# Patient Record
Sex: Female | Born: 1935
Health system: Southern US, Community
[De-identification: ages and names within clinical notes are randomized; demographics above are authoritative.]

## PROBLEM LIST (undated history)

## (undated) DIAGNOSIS — H3530 Unspecified macular degeneration: Secondary | ICD-10-CM

## (undated) DIAGNOSIS — M810 Age-related osteoporosis without current pathological fracture: Secondary | ICD-10-CM

## (undated) DIAGNOSIS — F0390 Unspecified dementia without behavioral disturbance: Secondary | ICD-10-CM

## (undated) DIAGNOSIS — M1990 Unspecified osteoarthritis, unspecified site: Secondary | ICD-10-CM

## (undated) DIAGNOSIS — C50919 Malignant neoplasm of unspecified site of unspecified female breast: Secondary | ICD-10-CM

## (undated) DIAGNOSIS — S329XXA Fracture of unspecified parts of lumbosacral spine and pelvis, initial encounter for closed fracture: Secondary | ICD-10-CM

## (undated) DIAGNOSIS — I10 Essential (primary) hypertension: Secondary | ICD-10-CM

## (undated) HISTORY — PX: OTHER SURGICAL HISTORY: SHX169

## (undated) HISTORY — DX: Age-related osteoporosis without current pathological fracture: M81.0

## (undated) HISTORY — DX: Malignant neoplasm of unspecified site of unspecified female breast: C50.919

## (undated) HISTORY — DX: Unspecified osteoarthritis, unspecified site: M19.90

## (undated) HISTORY — DX: Unspecified macular degeneration: H35.30

## (undated) HISTORY — PX: TOTAL HIP ARTHROPLASTY: SHX124

## (undated) HISTORY — DX: Unspecified dementia, unspecified severity, without behavioral disturbance, psychotic disturbance, mood disturbance, and anxiety: F03.90

## (undated) HISTORY — PX: BREAST LUMPECTOMY: SHX2

## (undated) HISTORY — DX: Essential (primary) hypertension: I10

---
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
## 2004-02-16 ENCOUNTER — Encounter: Payer: Self-pay | Admitting: Pain Medicine

## 2004-03-18 ENCOUNTER — Encounter: Payer: Self-pay | Admitting: Pain Medicine

## 2004-04-17 ENCOUNTER — Encounter: Payer: Self-pay | Admitting: Pain Medicine

## 2004-05-27 ENCOUNTER — Emergency Department: Payer: Self-pay | Admitting: Emergency Medicine

## 2004-05-28 ENCOUNTER — Ambulatory Visit: Payer: Self-pay | Admitting: Internal Medicine

## 2004-06-17 ENCOUNTER — Encounter: Payer: Self-pay | Admitting: Internal Medicine

## 2004-06-18 ENCOUNTER — Encounter: Payer: Self-pay | Admitting: Internal Medicine

## 2004-07-16 ENCOUNTER — Encounter: Payer: Self-pay | Admitting: Internal Medicine

## 2004-08-16 ENCOUNTER — Encounter: Payer: Self-pay | Admitting: Internal Medicine

## 2004-09-15 ENCOUNTER — Encounter: Payer: Self-pay | Admitting: Internal Medicine

## 2004-10-03 ENCOUNTER — Emergency Department: Payer: Self-pay | Admitting: Emergency Medicine

## 2004-10-10 ENCOUNTER — Ambulatory Visit: Payer: Self-pay | Admitting: Internal Medicine

## 2004-10-20 ENCOUNTER — Encounter: Payer: Self-pay | Admitting: Rheumatology

## 2005-02-26 ENCOUNTER — Ambulatory Visit: Payer: Self-pay | Admitting: Internal Medicine

## 2005-03-30 ENCOUNTER — Emergency Department: Payer: Self-pay | Admitting: Emergency Medicine

## 2005-05-03 ENCOUNTER — Emergency Department: Payer: Self-pay | Admitting: Emergency Medicine

## 2005-07-23 ENCOUNTER — Encounter: Payer: Self-pay | Admitting: Internal Medicine

## 2005-08-16 ENCOUNTER — Encounter: Payer: Self-pay | Admitting: Internal Medicine

## 2005-09-15 ENCOUNTER — Encounter: Payer: Self-pay | Admitting: Internal Medicine

## 2005-09-25 ENCOUNTER — Emergency Department: Payer: Self-pay | Admitting: Emergency Medicine

## 2005-09-26 ENCOUNTER — Ambulatory Visit: Payer: Self-pay | Admitting: Emergency Medicine

## 2005-09-26 ENCOUNTER — Other Ambulatory Visit: Payer: Self-pay

## 2005-10-16 ENCOUNTER — Encounter: Payer: Self-pay | Admitting: Internal Medicine

## 2005-11-15 ENCOUNTER — Encounter: Payer: Self-pay | Admitting: Internal Medicine

## 2005-12-16 ENCOUNTER — Encounter: Payer: Self-pay | Admitting: Internal Medicine

## 2006-01-16 ENCOUNTER — Encounter: Payer: Self-pay | Admitting: Internal Medicine

## 2006-02-15 ENCOUNTER — Encounter: Payer: Self-pay | Admitting: Internal Medicine

## 2006-03-21 ENCOUNTER — Inpatient Hospital Stay: Payer: Self-pay | Admitting: Internal Medicine

## 2006-03-21 ENCOUNTER — Other Ambulatory Visit: Payer: Self-pay

## 2006-03-22 ENCOUNTER — Other Ambulatory Visit: Payer: Self-pay

## 2006-03-24 ENCOUNTER — Encounter: Payer: Self-pay | Admitting: Internal Medicine

## 2006-06-01 ENCOUNTER — Ambulatory Visit: Payer: Self-pay | Admitting: Internal Medicine

## 2007-03-24 ENCOUNTER — Ambulatory Visit: Payer: Self-pay | Admitting: Unknown Physician Specialty

## 2007-07-20 ENCOUNTER — Ambulatory Visit: Payer: Self-pay | Admitting: Obstetrics and Gynecology

## 2007-10-03 ENCOUNTER — Ambulatory Visit: Payer: Self-pay | Admitting: Unknown Physician Specialty

## 2008-08-16 ENCOUNTER — Ambulatory Visit: Payer: Self-pay | Admitting: Obstetrics and Gynecology

## 2008-09-20 ENCOUNTER — Ambulatory Visit: Payer: Self-pay | Admitting: Internal Medicine

## 2008-12-20 ENCOUNTER — Ambulatory Visit: Payer: Self-pay | Admitting: Gastroenterology

## 2009-02-11 ENCOUNTER — Ambulatory Visit: Payer: Self-pay | Admitting: Unknown Physician Specialty

## 2009-02-28 ENCOUNTER — Ambulatory Visit: Payer: Self-pay | Admitting: Unknown Physician Specialty

## 2009-03-04 ENCOUNTER — Ambulatory Visit: Payer: Self-pay | Admitting: Unknown Physician Specialty

## 2009-08-19 ENCOUNTER — Ambulatory Visit: Payer: Self-pay | Admitting: Obstetrics and Gynecology

## 2014-06-18 HISTORY — PX: NM MYOVIEW LTD: HXRAD82

## 2017-03-18 HISTORY — PX: LEFT HEART CATH AND CORONARY ANGIOGRAPHY: CATH118249

## 2018-04-17 DIAGNOSIS — S72001S Fracture of unspecified part of neck of right femur, sequela: Secondary | ICD-10-CM

## 2018-04-17 HISTORY — DX: Fracture of unspecified part of neck of right femur, sequela: S72.001S

## 2018-05-10 HISTORY — PX: TRANSTHORACIC ECHOCARDIOGRAM: SHX275

## 2018-11-14 ENCOUNTER — Emergency Department (HOSPITAL_COMMUNITY): Payer: Medicare Other

## 2018-11-14 ENCOUNTER — Emergency Department (HOSPITAL_COMMUNITY)
Admission: EM | Admit: 2018-11-14 | Discharge: 2018-11-14 | Disposition: A | Discharge status: Home / Self Care | Payer: Medicare Other | Attending: Emergency Medicine | Admitting: Emergency Medicine

## 2018-11-14 ENCOUNTER — Encounter (HOSPITAL_COMMUNITY): Payer: Self-pay

## 2018-11-14 ENCOUNTER — Other Ambulatory Visit: Payer: Self-pay

## 2018-11-14 DIAGNOSIS — S329XXA Fracture of unspecified parts of lumbosacral spine and pelvis, initial encounter for closed fracture: Secondary | ICD-10-CM

## 2018-11-14 DIAGNOSIS — Y939 Activity, unspecified: Secondary | ICD-10-CM | POA: Insufficient documentation

## 2018-11-14 DIAGNOSIS — Y92128 Other place in nursing home as the place of occurrence of the external cause: Secondary | ICD-10-CM | POA: Insufficient documentation

## 2018-11-14 DIAGNOSIS — M25551 Pain in right hip: Secondary | ICD-10-CM | POA: Diagnosis present

## 2018-11-14 DIAGNOSIS — W19XXXA Unspecified fall, initial encounter: Secondary | ICD-10-CM | POA: Diagnosis not present

## 2018-11-14 DIAGNOSIS — F0390 Unspecified dementia without behavioral disturbance: Secondary | ICD-10-CM | POA: Insufficient documentation

## 2018-11-14 DIAGNOSIS — Z7901 Long term (current) use of anticoagulants: Secondary | ICD-10-CM | POA: Diagnosis not present

## 2018-11-14 DIAGNOSIS — Z20828 Contact with and (suspected) exposure to other viral communicable diseases: Secondary | ICD-10-CM | POA: Insufficient documentation

## 2018-11-14 DIAGNOSIS — Y999 Unspecified external cause status: Secondary | ICD-10-CM | POA: Diagnosis not present

## 2018-11-14 DIAGNOSIS — S3282XA Multiple fractures of pelvis without disruption of pelvic ring, initial encounter for closed fracture: Secondary | ICD-10-CM | POA: Insufficient documentation

## 2018-11-14 HISTORY — DX: Fracture of unspecified parts of lumbosacral spine and pelvis, initial encounter for closed fracture: S32.9XXA

## 2018-11-14 LAB — BASIC METABOLIC PANEL
Anion gap: 9 (ref 5–15)
BUN: 19 mg/dL (ref 8–23)
CO2: 26 mmol/L (ref 22–32)
Calcium: 9 mg/dL (ref 8.9–10.3)
Chloride: 105 mmol/L (ref 98–111)
Creatinine, Ser: 0.73 mg/dL (ref 0.44–1.00)
GFR calc Af Amer: >60 mL/min (ref >60)
GFR calc non Af Amer: >60 mL/min (ref >60)
Glucose, Bld: 99 mg/dL (ref 70–99)
Potassium: 3.7 mmol/L (ref 3.5–5.1)
Sodium: 140 mmol/L (ref 135–145)

## 2018-11-14 LAB — CBC WITH DIFFERENTIAL/PLATELET
Abs Immature Granulocytes: 0.16 10*3/uL — ABNORMAL HIGH (ref 0.00–0.07)
Basophils Absolute: 0 10*3/uL (ref 0.0–0.1)
Basophils Relative: 0 %
Eosinophils Absolute: 0.2 10*3/uL (ref 0.0–0.5)
Eosinophils Relative: 1 %
HCT: 41.4 % (ref 36.0–46.0)
Hemoglobin: 13 g/dL (ref 12.0–15.0)
Immature Granulocytes: 1 %
Lymphocytes Relative: 8 %
Lymphs Abs: 1.1 10*3/uL (ref 0.7–4.0)
MCH: 30.5 pg (ref 26.0–34.0)
MCHC: 31.4 g/dL (ref 30.0–36.0)
MCV: 97.2 fL (ref 80.0–100.0)
Monocytes Absolute: 0.6 10*3/uL (ref 0.1–1.0)
Monocytes Relative: 5 %
Neutro Abs: 10.7 10*3/uL — ABNORMAL HIGH (ref 1.7–7.7)
Neutrophils Relative %: 85 %
Platelets: 174 10*3/uL (ref 150–400)
RBC: 4.26 MIL/uL (ref 3.87–5.11)
RDW: 13.3 % (ref 11.5–15.5)
WBC: 12.8 10*3/uL — ABNORMAL HIGH (ref 4.0–10.5)
nRBC: 0 % (ref 0.0–0.2)

## 2018-11-14 LAB — SARS CORONAVIRUS 2 BY RT PCR (HOSPITAL ORDER, PERFORMED IN ~~LOC~~ HOSPITAL LAB): SARS Coronavirus 2: NEGATIVE

## 2018-11-14 MED ORDER — FENTANYL CITRATE (PF) 100 MCG/2ML IJ SOLN
50.0000 ug | Freq: Once | INTRAMUSCULAR | Status: AC
  Administered 2018-11-14: 50 ug via INTRAVENOUS
  Filled 2018-11-14: qty 2

## 2018-11-14 MED ORDER — TRAMADOL HCL 50 MG PO TABS: 50.0000 mg | ORAL_TABLET | Freq: Four times a day (QID) | ORAL | 0 refills | Status: DC | PRN

## 2018-11-14 MED ORDER — CARVEDILOL 3.125 MG PO TABS
3.1250 mg | ORAL_TABLET | Freq: Two times a day (BID) | ORAL | Status: DC
  Administered 2018-11-14: 15:00:00 3.125 mg via ORAL
  Filled 2018-11-14 (×2): qty 1

## 2018-11-14 MED ORDER — FENTANYL CITRATE (PF) 100 MCG/2ML IJ SOLN
50.0000 ug | Freq: Once | INTRAMUSCULAR | Status: AC
  Administered 2018-11-14: 15:00:00 50 ug via INTRAVENOUS
  Filled 2018-11-14: qty 2

## 2018-11-14 MED ORDER — AMLODIPINE BESYLATE 5 MG PO TABS
2.5000 mg | ORAL_TABLET | Freq: Two times a day (BID) | ORAL | Status: DC
  Administered 2018-11-14: 2.5 mg via ORAL
  Filled 2018-11-14: qty 1

## 2018-11-14 MED ORDER — MEMANTINE HCL 10 MG PO TABS
10.0000 mg | ORAL_TABLET | Freq: Two times a day (BID) | ORAL | Status: DC
  Administered 2018-11-14: 15:00:00 10 mg via ORAL
  Filled 2018-11-14 (×2): qty 1

## 2018-11-14 MED ORDER — ACETAMINOPHEN 500 MG PO TABS
1000.0000 mg | ORAL_TABLET | Freq: Once | ORAL | Status: AC
  Administered 2018-11-14: 13:00:00 1000 mg via ORAL
  Filled 2018-11-14: qty 2

## 2018-11-14 NOTE — ED Notes (Signed)
Bed: VQ25 Expected date:  Expected time:  Means of arrival:  Comments: 89 fall

## 2018-11-14 NOTE — ED Provider Notes (Signed)
Care assumed at shift change from [place], PA-C  Unwitnessed fall at living facility. Patient lives with her husband at the facility.  Level 5 CAVEAT Dementia  Right hip pain with malrotation. Dec 2019 right hip arthroplasty by [facility] Dr. Karenann Cai.  Normal neuro exam without deficits. No midline cervical tenderness or Neurologic abnormalities.  Patient pending CMP, COVID and plain film right hip, hand, chest at care transfer.  Plan to consult Ortho if abnormal xray.  Physical Exam  BP (!) 152/68   Pulse (!) 58   Temp 97.7 F (36.5 C) (Oral)   Resp 19   Ht 5\' 6"  (1.676 m)   Wt 77.1 kg   SpO2 93%   BMI 27.44 kg/m   Physical Exam Vitals signs and nursing note reviewed. Exam conducted with a chaperone present.  Constitutional:      General: She is not in acute distress.    Appearance: She is well-developed. She is not ill-appearing, toxic-appearing or diaphoretic.  HENT:     Head: Normocephalic and atraumatic.     Jaw: There is normal jaw occlusion.     Mouth/Throat:     Lips: Pink.     Mouth: Mucous membranes are dry.     Pharynx: Oropharynx is clear.  Eyes:     Pupils: Pupils are equal, round, and reactive to light.  Neck:     Musculoskeletal: Full passive range of motion without pain, normal range of motion and neck supple.     Trachea: Phonation normal.  Cardiovascular:     Rate and Rhythm: Normal rate.     Pulses: Normal pulses.          Dorsalis pedis pulses are 2+ on the right side and 2+ on the left side.       Posterior tibial pulses are 2+ on the right side and 2+ on the left side.     Heart sounds: Normal heart sounds. No murmur. No friction rub. No gallop.   Pulmonary:     Effort: Pulmonary effort is normal. No respiratory distress.     Breath sounds: Normal breath sounds.  Chest:     Comments: No chest wall TTP.  No tenderness over bilateral ribs. Abdominal:     General: Bowel sounds are normal. There is no distension.     Palpations: Abdomen is soft.   Tenderness: There is no abdominal tenderness. There is no right CVA tenderness, left CVA tenderness or guarding.     Hernia: No hernia is present.     Comments: Soft, Nontender without rebound or guarding.  No overlying skin changes to abdominal wall.  No flank tenderness.  Normoactive bowel sounds.  No abdominal wall herniations.  Musculoskeletal: Normal range of motion.     Right shoulder: Normal.     Left shoulder: Normal.     Right elbow: Normal.    Left elbow: Normal.     Right wrist: Normal.     Left wrist: Normal.     Right hip: She exhibits tenderness. She exhibits normal range of motion, normal strength, no bony tenderness, no swelling, no crepitus, no deformity and no laceration.     Left hip: Normal.     Right knee: Normal.     Left knee: Normal.     Right ankle: Normal.     Left ankle: Normal.     Cervical back: Normal.     Thoracic back: Normal.     Lumbar back: Normal.     Right lower leg: Normal.  Left lower leg: Normal.     Comments: Shortening of right leg. No knee, tibia/fibular ankle tenderness palpation to right lower extremity.  Left lower extremity nontender palpation.  Pelvis with mild tenderness to palpation R>L. Stable to pressure.  No midline cervical, thoracic or lumbar tenderness palpation. Echymmosis to right ulnar aspect of right hand. Full ROM bilateral UE without difficulty.  Feet:     Right foot:     Skin integrity: Skin integrity normal.     Left foot:     Skin integrity: Skin integrity normal.  Lymphadenopathy:     Cervical: No cervical adenopathy.  Skin:    General: Skin is warm and dry.     Capillary Refill: Capillary refill takes less than 2 seconds.  Neurological:     General: No focal deficit present.     Mental Status: She is alert.     Cranial Nerves: Cranial nerves are intact.     Sensory: Sensation is intact.     Motor: Motor function is intact.     Coordination: Coordination is intact.     Comments: Oriented to person, place.   Oriented to time.  Patient ambulatory with home walker with personal assistance.  This is her baseline.    ED Course/Procedures     Procedures Dg Chest 1 View  Result Date: 11/14/2018 CLINICAL DATA:  Fall. EXAM: CHEST  1 VIEW COMPARISON:  No prior. FINDINGS: Mediastinum hilar structures normal. Lungs are clear. No pleural effusion or pneumothorax. Cardiomegaly with normal pulmonary vascularity. Thoracic spine scoliosis. Degenerative changes both shoulders. No evidence of displaced rib fracture. IMPRESSION: No acute abnormality identified. Electronically Signed   By: Marcello Moores  Register   On: 11/14/2018 07:34   Ct Pelvis Wo Contrast  Result Date: 11/14/2018 CLINICAL DATA:  Fall from recliner. EXAM: CT PELVIS WITHOUT CONTRAST TECHNIQUE: Multidetector CT imaging of the pelvis was performed following the standard protocol without intravenous contrast. COMPARISON:  None. FINDINGS: Distinct and acute appearing fractures at the right inferior pubic ramus, right puboacetabular junction, and medial right inferior pubic ramus. Right-sided sacral insufficiency fracture without displacement or sacral foramina involvement. Intertrochanteric femur fracture on the right with ORIF. Much of the fracture lucency is still visible but there is bridging callus inferiorly. No acute soft tissue finding. No evidence of visceral injury within the pelvis and lower abdomen. IMPRESSION: 1. Acute appearing fractures of the right inferior and superior pubic rami. 2. Nondisplaced right sacral insufficiency fracture. 3. Non acute intertrochanteric right femur fracture with repair. Much of the fracture lucency is still visible but there is bridging callus inferiorly. Electronically Signed   By: Monte Fantasia M.D.   On: 11/14/2018 11:16   Dg Hand Complete Right  Result Date: 11/14/2018 CLINICAL DATA:  History of fall. EXAM: RIGHT HAND - COMPLETE 3+ VIEW COMPARISON:  No prior. FINDINGS: Diffuse osteopenia and degenerative change.  Degenerative changes most prominent about the first carpometacarpal joint, radiocarpal joint, and interphalangeal joints. No acute bony abnormality identified. IMPRESSION: Diffuse osteopenia degenerative change.  No acute abnormality. Electronically Signed   By: Marcello Moores  Register   On: 11/14/2018 07:43   Dg Hip Unilat W Or Wo Pelvis 2-3 Views Right  Result Date: 11/14/2018 CLINICAL DATA:  History of fall. EXAM: DG HIP (WITH OR WITHOUT PELVIS) 2-3V RIGHT COMPARISON:  No prior. FINDINGS: Diffuse osteopenia. Prior lower lumbar fusion. Prior ORIF right femur. Fracture lucency along the proximal femur noted. Hardware intact. Anatomic alignment. Slightly displaced fractures of the right superior inferior pubic rami are  noted, age undetermined. These could be acute. IMPRESSION: 1. Prior lumbar fusion. Prior ORIF right femur. Fracture lucency along the proximal right femur noted. Hardware intact. Anatomic alignment. 2. Slightly displaced fractures of the right superior inferior pubic rami are noted, age undetermined. These could be acute. Electronically Signed   By: Marcello Moores  Register   On: 11/14/2018 07:37   Labs Reviewed  CBC WITH DIFFERENTIAL/PLATELET - Abnormal; Notable for the following components:      Result Value   WBC 12.8 (*)    Neutro Abs 10.7 (*)    Abs Immature Granulocytes 0.16 (*)    All other components within normal limits  SARS CORONAVIRUS 2 (HOSPITAL ORDER, [place] [facility])  BASIC METABOLIC PANEL   MDM  85 year old who presents after unwitnessed  fall from [place]. Right hip pain with shortening.  Denies hitting head or LOC.  She is neurovascularly intact.  Labs and imaging ordered from previous provider and personally reviewed.  Suspected hip fracture based on exam, Right hip replaced by [facility] location) in Dec 2019. No CT imaging of head or cervical spine.  0745: Consulted with patient's daughter Jaquelyn Bitter at [phone] states patient  recently moved here approximately 3 months ago.  Her PCP is Christella Noa at [facility] in [place].  Daughter states that they do have an appointment on July 8 with Dr. Alvan Dame at [facility] to establish care for recurrent hip pain after her surgery in December.  She was previously seen at [facility] in [place] for her prior hip fracture.  Daughter states she does not want patient to be transferred to an outside facility or back to [facility] if she need surgical intervention.  1000: Consulted with CT on delay imaging. They were awaiting patient's COVID testing.  Discussed with them that she has been tested for COVID due to possible surgery.  She has no COVID-like symptoms.  1200: CT pelvis with inferior and superior pubic rami fractures.  She does have a nonacute right femur fracture repair with ORIF, likely from fall in December. No new hip fracture. Family(daughter)  at bedside. Requesting trial of ambulation and dc home. Has follow up appointment to establish care with Dr. Alvan Dame with [facility] next week. Will trial ambulation. Patient with pain however they do not want additional narcotic pain medication. Will trial Tylenol and reevaluate.   Patient with ambulation with 2 person assist however did not have a walker presents which she uses at baseline. Will obtain walker as daughter states patient is a two person assist at home with the walker at baseline.  Moves bilateral lower extremities freely in bed.  She does have shortening however no malrotation to her right lower extremity.  [place] from her prior ORIF.  Her abdomen is soft, nontender without rebound or guarding.  She continues to be neurovascularly intact while the emergency department.  1445: Personally ambulated patient with daughter and nursing in room with patient home walker.  Daughter states patient is at her baseline ambulation with walker. Pain controlled in ED.  Shared decision making to admit to hospitalist for pain control.  Discussed  risk vs benefit. Patient and daughter have decided to try home management. Patient has follow-up appointment with orthopedics. Discussed keeping her appointment or follow-up sooner if problems arise.  Does return to the emergency department if any new or worsening symptoms.  All questions answered.  Patient's daughter requesting additional IV pain medicine prior to DC as she states she  is afraid patient will have pain going over bumps in the car.  She has not required any IV pain medicine over the last 4 hours in the ED.  We will give additional medicine.  Patient discussed with attending Dr. Venora Maples who is in agreement with above treatment, plan and disposition.      Nettie Elm, PA-C 11/14/18 [place], MD 11/14/18 1538

## 2018-11-14 NOTE — Discharge Instructions (Addendum)
Follow-up with Dr. Ihor Gully with orthopedics within the next week.  Take the tramadol as prescribed.  May alternate with Tylenol and ibuprofen.  Do not take more than 4000 mg of Tylenol daily.  Alternate with Aleve.  If she develops worsening pain please seek reevaluation.  Return to the ED for any new or worsening symptoms.

## 2018-11-14 NOTE — ED Notes (Signed)
Patient transported to X-ray 

## 2018-11-14 NOTE — ED Provider Notes (Signed)
Sandra Cruz   CSN: [id number] Arrival date & time: 11/14/18  0541    History   Chief Complaint Chief Complaint  Patient presents with  . Fall    LEVEL 5 CAVEAT 2/2 DEMENTIA  HPI Sandra Cruz is a 85 y.o. female.    85 year old female with hx of dyslipidemia, hypertension, GERD presents to the emergency department from [facility] for an unwitnessed fall.  Patient with complaints of right hip pain.  EMS noting associated deformity.  She does have a history of dementia and uses a walker at baseline.  Patient denies hitting her head or losing consciousness.  No complaints of head or neck pain.  She is not on chronic anticoagulation.  History of prior right hip fracture in December 2019 with hip arthroplasty completed at [facility] (Dr. Karenann Cai)  The history is provided by the patient. No language interpreter was used.  Fall    History reviewed. No pertinent past medical history.  There are no active problems to display for this patient.   Past Surgical History:  Procedure Laterality Date  . TOTAL HIP ARTHROPLASTY       OB History   No obstetric history on file.      Home Medications    Prior to Admission medications   Not on File    Family History No family history on file.  Social History Social History   Tobacco Use  . Smoking status: Not on file  Substance Use Topics  . Alcohol use: Not on file  . Drug use: Not on file     Allergies   Patient has no allergy information on record.   Review of Systems Review of Systems  Unable to perform ROS: Dementia     Physical Exam Updated Vital Signs BP (!) 178/80 (BP Location: Right Arm)   Pulse (!) 55   Temp 97.7 F (36.5 C) (Oral)   Resp 16   Ht 5\' 6"  (1.676 m)   Wt 77.1 kg   SpO2 98%   BMI 27.44 kg/m   Physical Exam Vitals signs and nursing Cruz reviewed.  Constitutional:      General: She is not in acute distress.    Appearance: She is  well-developed. She is not diaphoretic.     Comments: Nontoxic appearing   HENT:     Head: Normocephalic and atraumatic.     Mouth/Throat:     Mouth: Mucous membranes are moist.  Eyes:     General: No scleral icterus.    Conjunctiva/sclera: Conjunctivae normal.  Neck:     Musculoskeletal: Normal range of motion.     Comments: Cervical collar applied in the field by EMS.  Cervical midline palpated without bony deformity, crepitus, tenderness.  Cervical collar removed. Cardiovascular:     Rate and Rhythm: Normal rate and regular rhythm.     Pulses: Normal pulses.  Pulmonary:     Effort: Pulmonary effort is normal. No respiratory distress.     Comments: Respirations even and unlabored Musculoskeletal:     Comments: Decreased range of motion of the right hip with associated deformity.  There is right leg shortening, malrotation.  Hematoma noted to the ulnar aspect of the right hand proximal to the fourth and fifth MCP joints.  No associated tenderness to palpation.  Skin:    General: Skin is warm and dry.     Coloration: Skin is not pale.     Findings: No erythema or rash.  Neurological:  General: No focal deficit present.     Mental Status: She is alert.     Coordination: Coordination normal.     Comments: Patient alert, moving all extremities spontaneously.  Speech is goal oriented.  Psychiatric:        Behavior: Behavior normal.      ED Treatments / Results  Labs (all labs ordered are listed, but only abnormal results are displayed) Labs Reviewed  CBC WITH DIFFERENTIAL/PLATELET - Abnormal; Notable for the following components:      Result Value   WBC 12.8 (*)    Neutro Abs 10.7 (*)    Abs Immature Granulocytes 0.16 (*)    All other components within normal limits  BASIC METABOLIC PANEL    EKG None  Radiology No results found.  Procedures Procedures (including critical care time)  Medications Ordered in ED Medications - No data to display   6:33 AM Call  placed to Dyann Kief (patient contact) listed under Demographics. No answer.   Initial Impression / Assessment and Plan / ED Course  I have reviewed the triage vital signs and the nursing notes.  Pertinent labs & imaging results that were available during my care of the patient were reviewed by me and considered in my medical decision making (see chart for details).        85 year old female presents from [place] after an unwitnessed fall.  She has complaints of right hip pain with leg shortening and malrotation.  Denies hitting her head or losing consciousness.  Is neurovascularly intact on exam.  Labs and x-rays ordered.  Patient pending results of imaging.  Suspect hip fracture based on clinical exam.  This right hip was fractured back in December 2019.  Patient underwent repair at [facility] at the time of this injury.  If imaging shows new, acute fracture, patient to require orthopedic consultation.    Care signed out to [facility] , PA-C at shift change who will follow-up on imaging and disposition appropriately.   Final Clinical Impressions(s) / ED Diagnoses   Final diagnoses:  Fall, initial encounter    ED Discharge Orders    None       Antonietta Breach, PA-C 11/14/18 [place], [place], MD 11/14/18 [phone]

## 2018-11-14 NOTE — ED Triage Notes (Signed)
Per ems: Pt coming from [place] for an unwitnessed fall c/o right hip with noted deformity. Hx of dementia and uses walker at baseline. No LOC and no blood thinners. Hx of previous hip fracture   20 L wrist 50 of fentanyl given by ems

## 2018-11-14 NOTE — ED Notes (Signed)
Pt ambulated with assist x2.  She leaned heavily on me and her daughter for support to take a few steps.  She was able to take a few steps but she said it was very painful and will probably need more help to walk besides a walker until the pain is more bearable.

## 2018-12-27 ENCOUNTER — Encounter: Payer: Self-pay | Admitting: Neurology

## 2018-12-27 ENCOUNTER — Other Ambulatory Visit: Payer: Self-pay

## 2018-12-27 ENCOUNTER — Ambulatory Visit (INDEPENDENT_AMBULATORY_CARE_PROVIDER_SITE_OTHER): Payer: Medicare Other | Admitting: Neurology

## 2018-12-27 VITALS — BP 144/70 | HR 53 | Temp 97.8°F

## 2018-12-27 DIAGNOSIS — F0390 Unspecified dementia without behavioral disturbance: Secondary | ICD-10-CM | POA: Diagnosis not present

## 2018-12-27 NOTE — Patient Instructions (Signed)
Follow up 6 months  Memory Compensation Strategies  1. Use "WARM" strategy.  W= write it down  A= associate it  R= repeat it  M= make a mental note  2.   You can keep a Social worker.  Use a 3-ring notebook with sections for the following: calendar, important names and phone numbers,  medications, doctors' names/phone numbers, lists/reminders, and a section to journal what you did  each day.   3.    Use a calendar to write appointments down.  4.    Write yourself a schedule for the day.  This can be placed on the calendar or in a separate section of the Memory Notebook.  Keeping a  regular schedule can help memory.  5.    Use medication organizer with sections for each day or morning/evening pills.  You may need help loading it  6.    Keep a basket, or pegboard by the door.  Place items that you need to take out with you in the basket or on the pegboard.  You may also want to  include a message board for reminders.  7.    Use sticky notes.  Place sticky notes with reminders in a place where the task is performed.  For example: " turn off the  stove" placed by the stove, "lock the door" placed on the door at eye level, " take your medications" on  the bathroom mirror or by the place where you normally take your medications.  8.    Use alarms/timers.  Use while cooking to remind yourself to check on food or as a reminder to take your medicine, or as a  reminder to make a call, or as a reminder to perform another task, etc.  Dementia Dementia is a condition that affects the way the brain functions. It often affects memory and thinking. Usually, dementia gets worse with time and cannot be reversed (progressive dementia). There are many types of dementia, including:  Alzheimer's disease. This type is the most common.  Vascular dementia. This type may happen as the result of a stroke.  Lewy body dementia. This type may happen to people who have Parkinson's disease.  Frontotemporal  dementia. This type is caused by damage to nerve cells (neurons) in certain parts of the brain. Some people may be affected by more than one type of dementia. This is called mixed dementia. What are the causes? Dementia is caused by damage to cells in the brain. The area of the brain and the types of cells damaged determine the type of dementia. Usually, this damage is irreversible or cannot be undone. Some examples of irreversible causes include:  Conditions that affect the blood vessels of the brain, such as diabetes, heart disease, or blood vessel disease.  Genetic mutations. In some cases, changes in the brain may be caused by another condition and can be reversed or slowed. Some examples of reversible causes include:  Injury to the brain.  Certain medicines.  Infection, such as meningitis.  Metabolic problems, such as vitamin B12 deficiency or thyroid disease.  Pressure on the brain, such as from a tumor or blood clot. What are the signs or symptoms? Symptoms of dementia depend on the type of dementia. Common signs of dementia include problems with remembering, thinking, problem solving, decision making, and communicating. These signs develop slowly or get worse with time. This may include:  Problems remembering things.  Having trouble taking a bath or putting clothes on.  Forgetting appointments.  Forgetting to pay bills.  Difficulty planning and preparing meals.  Having trouble speaking.  Getting lost easily. How is this diagnosed? This condition is diagnosed by a specialist (neurologist). It is diagnosed based on the history of your symptoms, your medical history, a physical exam, and tests. Tests may include:  Tests to evaluate brain function, such as memory tests, cognitive tests, and other tests.  Lab tests, such as blood or urine tests.  Imaging tests, such as a CT scan, a PET scan, or an MRI.  Genetic testing. This may be done if other family members have a  diagnosis of certain types of dementia. Your health care provider will talk with you and your family, friends, or caregivers about your history and symptoms. How is this treated?  Treatment for this condition depends on the cause of the dementia. Progressive dementias, such as Alzheimer's disease, cannot be cured, but there may be treatments that help to manage symptoms. Treatment might involve taking medicines that may help to:  Control the dementia.  Slow down the progression of the dementia.  Manage symptoms. In some cases, treating the cause of your dementia can improve symptoms, reverse symptoms, or slow down how quickly your dementia becomes worse. Your health care provider can direct you to support groups, organizations, and other health care providers who can help with decisions about your care. Follow these instructions at home: Medicines  Take over-the-counter and prescription medicines only as told by your health care provider.  Use a pill organizer or pill reminder to help you manage your medicines.  Avoid taking medicines that can affect thinking, such as pain medicines or sleeping medicines. Lifestyle  Make healthy lifestyle choices. ? Be physically active as told by your health care provider. ? Do not use any products that contain nicotine or tobacco, such as cigarettes, e-cigarettes, and chewing tobacco. If you need help quitting, ask your health care provider. ? Do not drink alcohol. ? Practice stress-management techniques when you get stressed. ? Spend time with other people.  Make sure to get quality sleep. These tips can help you get a good night's rest: ? Avoid napping during the day. ? Keep your sleeping area dark and cool. ? Avoid exercising during the few hours before you go to bed. ? Avoid caffeine products in the evening. Eating and drinking  Drink enough fluid to keep your urine pale yellow.  Eat a healthy diet. General instructions   Work with  your health care provider to determine what you need help with and what your safety needs are.  Talk with your health care provider about whether it is safe for you to drive.  If you were given a bracelet that identifies you as a person with memory loss or tracks your location, make sure to wear it at all times.  Work with your family to make important decisions, such as advance directives, medical power of attorney, or a living will.  Keep all follow-up visits as told by your health care provider. This is important. Where to find more information  Alzheimer's Association: CapitalMile.co.nz  National Institute on Aging: DVDEnthusiasts.nl  World Health Organization: RoleLink.com.br Contact a health care provider if:  You have any new or worsening symptoms.  You have problems with choking or swallowing. Get help right away if:  You feel depressed or sad, or feel that you want to harm yourself.  Your family members become concerned for your safety. If you ever feel like you may hurt yourself or others,  or have thoughts about taking your own life, get help right away. You can go to your nearest emergency department or call:  Your local emergency services (911 in the [place]  A suicide crisis helpline, such as the [place] at (614)036-4160. This is open 24 hours a day. Summary  Dementia is a condition that affects the way the brain functions. Dementia often affects memory and thinking.  Usually, dementia gets worse with time and cannot be reversed (progressive dementia).  Treatment for this condition depends on the cause of the dementia.  Work with your health care provider to determine what you need help with and what your safety needs are.  Your health care provider can direct you to support groups, organizations, and other health care providers who can help with decisions about your care. This information is not intended to replace advice given to  you by your health care provider. Make sure you discuss any questions you have with your health care provider. Document Released: 10/28/2000 Document Revised: 07/19/2018 Document Reviewed: 07/19/2018 Elsevier Patient Education  2020 [employer].

## 2018-12-27 NOTE — Progress Notes (Signed)
[facility]    Provider:  Dr Jaynee Eagles Requesting Provider: Orpah Melter, MD Primary Care Provider:  Orpah Melter, MD  CC:  dementia  HPI:  Sandra Cruz is a 85 y.o. female here as requested by Orpah Melter, MD for dementia.  Past medical history of hypertension, dementia, osteoarthritis of the knee, macular degeneration.  Patient is already on Aricept and Namenda.They live at [place]. She is happy. They have been married for 23 years. We discussed dementia and medications. Husband is a Theme park manager. Daughter and husband are here. Daughter has moved family close to her to help them. Husband expressed his frustration and difficulties being a caretaker, we discussed caretaker burnout and I provided community resources for them and advised to get husband more help or maybe go from independent living to assisted care for them. Daughter involved in conversation and appears very involved and understands concerns. Will request prior neurology notes.   Reviewed notes, labs and imaging from outside physicians, which showed:  I reviewed notes from Dr. Bjorn Loser.  She recently moved from [facility]  She is here to establish care.  Neurology has diagnosed her with dementia.  She is taking Aricept and family has seen improvement in her memory issues since Namenda was added.  She will continue Aricept and Namenda. She is also on vitamin B12.  She is also on mirtazapine.  She has had falls in the past.  She has had a hip fracture.  She also has had anxiety and depression.  She has become more forgetful and anxious.  I reviewed labs from July 07, 2018 which included BUN of 11 and creatinine of 0.64, alk phos was slightly elevated, CO2 slightly elevated otherwise quite unremarkable.    Review of Systems: Patient complains of symptoms per HPI as well as the following symptoms: dementia. Pertinent negatives and positives per HPI. All others negative.   Social History    Socioeconomic History  . Marital status: Married    Spouse name: Not on file  . Number of children: 3  . Years of education: Not on file  . Highest education level: Master's degree (e.g., MA, MS, MEng, MEd, MSW, MBA)  Occupational History  . Not on file  Social Needs  . Financial resource strain: Not on file  . Food insecurity    Worry: Not on file    Inability: Not on file  . Transportation needs    Medical: Not on file    Non-medical: Not on file  Tobacco Use  . Smoking status: Never Smoker  . Smokeless tobacco: Never Used  Substance and Sexual Activity  . Alcohol use: Never    Frequency: Never  . Drug use: Never  . Sexual activity: Not on file  Lifestyle  . Physical activity    Days per week: Not on file    Minutes per session: Not on file  . Stress: Not on file  Relationships  . Social Herbalist on phone: Not on file    Gets together: Not on file    Attends religious service: Not on file    Active member of club or organization: Not on file    Attends meetings of clubs or organizations: Not on file    Relationship status: Not on file  . Intimate partner violence    Fear of current or ex partner: Not on file    Emotionally abused: Not on file    Physically abused: Not on file  Forced sexual activity: Not on file  Other Topics Concern  . Not on file  Social History Narrative   Lives at home with husband    Family History  Problem Relation Age of Onset  . Stroke Mother   . Cancer Father   . Cancer Sister   . Cancer Brother   . Cancer Other        2 sisters and several brothers    Past Medical History:  Diagnosis Date  . Dementia ([place])   . Hypertension   . Macular degeneration   . Pelvic fracture ([place]) 11/14/2018   and coccyx    Patient Active Problem List   Diagnosis Date Noted  . Dementia without behavioral disturbance ([place]) 12/28/2018    Past Surgical History:  Procedure Laterality Date  . BREAST LUMPECTOMY     malignant   . cyst removal     from lower back, rods placed  . TOTAL HIP ARTHROPLASTY     broken femur, R side, repaired    Current Outpatient Medications  Medication Sig Dispense Refill  . amLODipine (NORVASC) 2.5 MG tablet Take 2.5 mg by mouth 2 (two) times a day.    . Calcium-Magnesium-Zinc (CAL-MAG-ZINC PO) Take 1 tablet by mouth daily at 12 noon. Calcium 650 mg Magnesium 200 mg  Zinc 10 mg    . carvedilol (COREG) 3.125 MG tablet Take 3.125 mg by mouth 2 (two) times a day.    . Cranberry-Vitamin C (AZO CRANBERRY URINARY TRACT PO) Take 2 tablets by mouth daily.    [name] docusate sodium (COLACE) 100 MG capsule Take 100 mg by mouth as needed for mild constipation.    [name] donepezil (ARICEPT) 10 MG tablet Take 10 mg by mouth every evening.    . Ferrous Sulfate (IRON PO) Take 65 mg by mouth daily at 12 noon.    . Ginger, Zingiber officinalis, (GINGER ROOT) 550 MG CAPS Take 1 each by mouth daily at 12 noon.    . Ginkgo Biloba (GNP GINGKO BILOBA EXTRACT PO) Take 120 mg by mouth daily at 12 noon.    . Melatonin 3 MG TABS Take 4.5 mg by mouth at bedtime.    . memantine (NAMENDA) 5 MG tablet Take 10 mg by mouth 2 (two) times a day.    . Multiple Vitamin (MULTIVITAMIN PO) 1 tablet at noon. Brand: Life extension-High Potency Multivitamin and Mineral Supplement    . Omega-3 Fatty Acids (FISH OIL) 500 MG CAPS Take 1 each by mouth daily at 12 noon.    . Probiotic Product (PROBIOTIC PO) Take 1 tablet by mouth daily at 12 noon. 15 strains/25 billion microorganisms    . senna (SENOKOT) 8.6 MG TABS tablet Take 1 tablet by mouth as needed for mild constipation.    . sodium chloride 1 g tablet Take 1 g by mouth daily.    [name] UNABLE TO FIND Take 1 g by mouth daily at 12 noon. Med Name: [place]    . naproxen sodium (ALEVE) 220 MG tablet Take 440 mg by mouth daily as needed (pain).    . traMADol (ULTRAM) 50 MG tablet Take 1 tablet (50 mg total) by mouth every 6 (six) hours as needed. 15 tablet 0   No current  facility-administered medications for this visit.     Allergies as of 12/27/2018 - Review Complete 12/27/2018  Allergen Reaction Noted  . Codeine Shortness Of Breath 01/01/2012  . Penicillins Swelling and Rash 12/29/2012  . Aspirin  11/14/2018  .  Latex Other (See Comments) 11/14/2018    Vitals: BP (!) 144/70 (BP Location: Right Arm, Patient Position: Sitting)   Pulse (!) 53   Temp 97.8 F (36.6 C) Comment: guest 98. taken by front staff Last Weight:  Wt Readings from Last 1 Encounters:  11/14/18 170 lb (77.1 kg)   Last Height:   Ht Readings from Last 1 Encounters:  11/14/18 5' 6"  (1.676 m)     Physical exam: Exam: Gen: NAD, conversant, well nourised, well groomed      CV: RRR, no MRG. No Carotid Bruits. No peripheral edema, warm, nontender Eyes: Conjunctivae clear without exudates or hemorrhage  Neuro: Detailed Neurologic Exam  Speech:    Speech is normal; fluent  Cognition:    The patient is oriented to person     recent and remote memory impaired;     language fluent;     Impaired attention, concentration, fund of knowledge Cranial Nerves:    The pupils are equal, round, and reactive to light. Attempted fundoscopic exam could not visualize.  Visual fields are full to finger confrontation. Extraocular movements are intact. Trigeminal sensation is intact and the muscles of mastication are normal. The face is symmetric. The palate elevates in the midline. Hearing intact. Voice is normal. Shoulder shrug is normal. The tongue has normal motion without fasciculations.   Coordination:    No dysmetria noted   Gait:    In a wheelchair, attempted but fall risk  Motor Observation:    No asymmetry, no atrophy, and no involuntary movements noted. Tone:    Normal muscle tone.    Posture:    Posture is normal in wheelchair    Strength:    Strength is equal and symmetric difficult exam due to dementia no focal deficits noted     Sensation: intact to LT     Reflex  Exam:  DTR's:    Deep tendon reflexes in the upper and lower extremities are symmetrical bilaterally.   Toes:    The toes are equiv bilaterally.   Clonus:    Clonus is absent.    Assessment/Plan:  85 y.o. female here as requested by Orpah Melter, MD for dementia.  Past medical history of hypertension, dementia, osteoarthritis of the knee, macular degeneration.  Patient is already on Aricept and Namenda. Has established diagnosis of dementia and had a prior neurologist, establishing care in the area just moved.  -Patient has established dementia.  Continue Aricept and Namenda.  - Will request neurology notes from practice and review at next appointment  - fall precautions.  Today's history and physical demonstrated very substantial and measurable cognitive losses consistent with dementia. It is clear that she does not have the capacity to make informed and appropriate decisions on her healthcare and finances. I do recommend that she lives in a structured setting and at a higher level of care than she is now. It is also clear that patient does not comprehend the degree of cognitive losses. Patient is suffering from substantial cognitive impairment due to dementia.   Cc: Orpah Melter, MD   A total of 60 minutes was spent face-to-face with this patient. Over half this time was spent on counseling patient on the  1. Dementia without behavioral disturbance, unspecified dementia type ([place])    diagnosis and different diagnostic and therapeutic options, counseling and coordination of care, risks ans benefits of management, compliance, or risk factor reduction and education.     Sarina Ill, MD  [facility] [phone]  [place], [place] [address]  Phone [phone] Fax [phone]

## 2018-12-28 ENCOUNTER — Telehealth: Payer: Self-pay | Admitting: *Deleted

## 2018-12-28 ENCOUNTER — Encounter: Payer: Self-pay | Admitting: Neurology

## 2018-12-28 DIAGNOSIS — G301 Alzheimer's disease with late onset: Secondary | ICD-10-CM | POA: Insufficient documentation

## 2018-12-28 DIAGNOSIS — F0390 Unspecified dementia without behavioral disturbance: Secondary | ICD-10-CM | POA: Insufficient documentation

## 2018-12-28 NOTE — Telephone Encounter (Signed)
Request faxed on 12/28/18 to Dr Tillie Fantasia [phone]

## 2018-12-28 NOTE — Telephone Encounter (Signed)
R/c note from Dr Tillie Fantasia notes on [place] desk.

## 2019-01-31 ENCOUNTER — Other Ambulatory Visit: Payer: Self-pay | Admitting: Family Medicine

## 2019-01-31 DIAGNOSIS — S32301A Unspecified fracture of right ilium, initial encounter for closed fracture: Secondary | ICD-10-CM

## 2019-02-14 ENCOUNTER — Other Ambulatory Visit: Payer: Self-pay | Admitting: Family Medicine

## 2019-02-14 DIAGNOSIS — E2839 Other primary ovarian failure: Secondary | ICD-10-CM

## 2019-02-14 DIAGNOSIS — S32301A Unspecified fracture of right ilium, initial encounter for closed fracture: Secondary | ICD-10-CM

## 2019-02-16 ENCOUNTER — Ambulatory Visit
Admission: RE | Admit: 2019-02-16 | Discharge: 2019-02-16 | Disposition: A | Discharge status: Home / Self Care | Payer: Medicare Other | Source: Ambulatory Visit | Attending: Family Medicine | Admitting: Family Medicine

## 2019-02-16 ENCOUNTER — Other Ambulatory Visit: Payer: Self-pay

## 2019-02-16 DIAGNOSIS — S32301A Unspecified fracture of right ilium, initial encounter for closed fracture: Secondary | ICD-10-CM

## 2019-02-16 DIAGNOSIS — E2839 Other primary ovarian failure: Secondary | ICD-10-CM

## 2019-04-03 ENCOUNTER — Other Ambulatory Visit: Payer: Medicare Other

## 2019-06-15 ENCOUNTER — Encounter: Payer: Self-pay | Admitting: Cardiology

## 2019-06-15 ENCOUNTER — Ambulatory Visit (INDEPENDENT_AMBULATORY_CARE_PROVIDER_SITE_OTHER): Payer: Medicare Other | Admitting: Cardiology

## 2019-06-15 ENCOUNTER — Other Ambulatory Visit: Payer: Self-pay

## 2019-06-15 VITALS — BP 161/72 | HR 59 | Ht 66.0 in | Wt 148.0 lb

## 2019-06-15 DIAGNOSIS — E782 Mixed hyperlipidemia: Secondary | ICD-10-CM

## 2019-06-15 DIAGNOSIS — I2510 Atherosclerotic heart disease of native coronary artery without angina pectoris: Secondary | ICD-10-CM

## 2019-06-15 DIAGNOSIS — I10 Essential (primary) hypertension: Secondary | ICD-10-CM

## 2019-06-15 DIAGNOSIS — Z7189 Other specified counseling: Secondary | ICD-10-CM

## 2019-06-15 DIAGNOSIS — R079 Chest pain, unspecified: Secondary | ICD-10-CM

## 2019-06-15 NOTE — Patient Instructions (Signed)
Medication Instructions:   no changes *If you need a refill on your cardiac medications before your next appointment, please call your pharmacy*  Lab Work: Not needed  Testing/Procedures:  not needed  Follow-Up: At [facility], you and your health needs are our priority.  As part of our continuing mission to provide you with exceptional heart care, we have created designated Provider Care Teams.  These Care Teams include your primary Cardiologist (physician) and Advanced Practice Providers (APPs -  Physician Assistants and Nurse Practitioners) who all work together to provide you with the care you need, when you need it.  Your next appointment:   6 month(s)  The format for your next appointment:   In Person  Provider:   Glenetta Hew, MD

## 2019-06-15 NOTE — Progress Notes (Signed)
Primary Care Provider: London Pepper, MD Cardiologist: No primary care provider on file. Electrophysiologist: None Neurologist: Dr. Hilaria Ota: Dr. Alvan Dame   Clinic Note: Chief Complaint  Patient presents with  . New Patient (Initial Visit)    Requested visit with Dr. Ellyn Hack who just saw her husband  . Chest Pain    HPI:    Sandra Cruz - "Sandra Cruz" is a 86 y.o. female with a PMH notable for HTN, dementia, osteoarthritis, breast CA & moderate-non-obstructive CAD by Cath (2018) who is being seen today for the evaluation of Chest Pains at the request of London Pepper, MD. Sandra Cruz did previously been followed by [facility]-last seen in August 2019.  Most recent carotid evaluation was with an echocardiogram in December 2019.  Sandra Cruz is an 86 year old woman with moderate dementia.  She is the wife of the patient recently seen and evaluated by me for CAD.  As requested by the family, she presents here to be evaluated for chest pain. --She is accompanied by her husband and daughter Jaquelyn Bitter, phone [phone]) who is the major caregiver despite the fact that they live in an independent living facility.  Recent Hospitalizations:   November 14, 2018: ER visit for unwitnessed fall with multiple pelvic fractures.  CT of the pelvis showed inferior and superior pubic rami fractures.  Nonacute right femur fracture repair (ORIF) from fall in December 2019. ->  Discharged home to follow-up with Dr. Ihor Gully  Reviewed  CV studies:    The following studies were reviewed today: (if available, images/films reviewed: From Epic Chart or Care Everywhere) -Reports noted from care [place] catheterization (03/2017): Non-obstructive coronary artery disease (as described above) including a 50% mid LAD stenosis (Resting Pd/Pa: 0.96) .  EF 55%; large LAD (diffuse moderate disease) with 3 diagonal branches.  D2 and D3 are small; #1 large caliber LCx with 3 OM branches (OM1 and OM  2 are small, OM 3 medium caliber main branch with minimal disease.  Large dominant RCA with moderate proximal disease, mid 30 to 40%.  Medium sized PDA with minimal disease.   Echo (05/10/2018): LVEF 60 to 65%.  GR 1 DD.  Normal RV.  Mild RA and mild to moderate LA dilation.  Sclerotic aortic valve with no stenosis.   Nuclear Stress (06/2014): no evidence of myocardial ischemia or infarction    Interval History:   Sandra Cruz presents here today for evaluation of intermittent episodes of chest discomfort and shortness of breath.  The spells can come on randomly without any disturbing factors.  Nothing incites them.  They tend to be related to anxiety because she has almost hyperventilation with them.  She does not do much exertion therefore did not really associated with exertion because they are not noted when she walks.  She has to stop off and on when she walks more because of hip and knee pain.  This takes her breath away but is not true dyspnea. The episodes are described as a feeling of quivering and pain in her chest where she is try to catch her breath.  Usually, the treatment of choice that her daughter has is to give her to [facility] and 1/2 cup of coffee followed by laying down for a nap.  By the time she finishes her coffee, she is feeling better and more relaxed.  She is trying to do some exercises and physical therapy and when she does a lot she will get short of  breath, and she also notes some shortness of breath that often occurs as she is lying down but not once she has fully become supine.  She does have some mild swelling but mostly related to the leg fracture.  CV Review of Symptoms (Summary): positive for - chest pain, palpitations and shortness of breath negative for - dyspnea on exertion, edema, orthopnea, paroxysmal nocturnal dyspnea or Syncope/near syncope, TIA/amaurosis fugax.  The patient does not have symptoms concerning for COVID-19 infection (fever, chills,  cough, or new shortness of breath).  The patient is practicing social distancing & Masking.    REVIEWED OF SYSTEMS   A comprehensive ROS was performed. Review of Systems  Constitutional: Positive for malaise/fatigue (Does not seem to have much energy.  Lack of desire.) and weight loss (Not eating and drinking very well).  HENT: Negative for nosebleeds.   Respiratory: Positive for shortness of breath (Per HPI). Negative for cough.   Cardiovascular: Negative for leg swelling (She does occasionally get swelling on the right side since her fracture.).  Gastrointestinal: Negative for blood in stool, heartburn and melena.  Genitourinary: Positive for frequency. Negative for hematuria.  Musculoskeletal: Positive for joint pain. Negative for falls (None reported since June).       Has become quite debilitated since her fall in June compounding the fall last December 2019  Neurological: Positive for dizziness and weakness (Generalized). Negative for seizures, loss of consciousness and headaches.  Endo/Heme/Allergies: Negative for environmental allergies.  Psychiatric/Behavioral: Positive for memory loss. Negative for depression and hallucinations (Not really hallucinations, she knows things are not real.). The patient is nervous/anxious and has insomnia.    I have reviewed and (if needed) personally updated the patient's problem list, medications, allergies, past medical and surgical history, social and family history.   PAST MEDICAL HISTORY   Past Medical History:  Diagnosis Date  . Breast cancer ([place])    Reportedly admission  . Dementia ([place])   . Hip fracture requiring operative repair, right, sequela 04/2018   Likely related to fall- s/p ORIF-right femur fracture  . Hypertension   . Macular degeneration   . Osteoarthritis (arthritis due to wear and tear of joints)    Hips and knees as well as hands  . Osteoporosis   . Pelvic fracture ([place]) 11/14/2018   and coccyx; late the fall.   Medical management   December 2019 right hip arthroplasty for malrotation and hip pain.  PAST SURGICAL HISTORY   Past Surgical History:  Procedure Laterality Date  . BREAST LUMPECTOMY     malignant  . cyst removal     from lower back, rods placed  . LEFT HEART CATH AND CORONARY ANGIOGRAPHY  03/2017   [facility]): EF 55%; large LAD (diffuse moderate disease) with 3 diagonal branches.  D2 and D3 are small; #1 large caliber LCx with 3 OM branches (OM1 and OM 2 are small, OM 3 medium caliber main branch with minimal disease.  Large dominant RCA with moderate proximal disease, mid 30 to 40%.  Medium sized PDA with minimal disease; FFR 50% mLAD - 0.96* NOT SIGNIFICANT  . NM MYOVIEW LTD  06/2014   [facility]: No evidence of ischemia or infarction.  [name] TOTAL HIP ARTHROPLASTY     broken femur, R side, repaired  . TRANSTHORACIC ECHOCARDIOGRAM  05/10/2018    LVEF 60 to 65%.  GR 1 DD.  Normal RV.  Mild RA and mild to moderate LA dilation.  Sclerotic aortic valve with no  stenosis.    MEDICATIONS/ALLERGIES   Current Meds  Medication Sig  . amLODipine (NORVASC) 2.5 MG tablet Take 2.5 mg by mouth 2 (two) times a day.  . Calcium-Magnesium-Zinc (CAL-MAG-ZINC PO) Take 1 tablet by mouth daily at 12 noon. Calcium 650 mg Magnesium 200 mg  Zinc 10 mg  . carvedilol (COREG) 3.125 MG tablet Take 3.125 mg by mouth 2 (two) times a day.  . Cranberry-Vitamin C (AZO CRANBERRY URINARY TRACT PO) Take 2 tablets by mouth daily.  [name] docusate sodium (COLACE) 100 MG capsule Take 100 mg by mouth as needed for mild constipation.  [name] donepezil (ARICEPT) 10 MG tablet Take 10 mg by mouth every evening.  . Ferrous Sulfate (IRON PO) Take 65 mg by mouth daily at 12 noon.  . Ginger, Zingiber officinalis, (GINGER ROOT) 550 MG CAPS Take 1 each by mouth daily at 12 noon.  . Ginkgo Biloba (GNP GINGKO BILOBA EXTRACT PO) Take 120 mg by mouth daily at 12 noon.  . Melatonin 3 MG TABS Take 4.5 mg by mouth at bedtime.    . memantine (NAMENDA) 5 MG tablet Take 10 mg by mouth 2 (two) times a day.  . Multiple Vitamin (MULTIVITAMIN PO) 1 tablet at noon. Brand: Life extension-High Potency Multivitamin and Mineral Supplement  . naproxen sodium (ALEVE) 220 MG tablet Take 440 mg by mouth daily as needed (pain).  . Omega-3 Fatty Acids (FISH OIL) 500 MG CAPS Take 1 each by mouth daily at 12 noon.  . Probiotic Product (PROBIOTIC PO) Take 1 tablet by mouth daily at 12 noon. 15 strains/25 billion microorganisms  . senna (SENOKOT) 8.6 MG TABS tablet Take 1 tablet by mouth as needed for mild constipation.  . sodium chloride 1 g tablet Take 1 g by mouth daily.  . traMADol (ULTRAM) 50 MG tablet Take 1 tablet (50 mg total) by mouth every 6 (six) hours as needed.  [name] UNABLE TO FIND Take 1 g by mouth daily at 12 noon. Med Name: Salt Tablet    Allergies  Allergen Reactions  . Codeine Shortness Of Breath  . Penicillins Swelling and Rash    Did it involve swelling of the face/tongue/throat, SOB, or low BP? No Did it involve sudden or severe rash/hives, skin peeling, or any reaction on the inside of your mouth or nose? No Did you need to seek medical attention at a hospital or doctor's office? No When did it last happen?childhood If all above answers are "NO", may proceed with cephalosporin use.  . Latex Other (See Comments)    blisters    SOCIAL HISTORY/FAMILY HISTORY   Social History   Tobacco Use  . Smoking status: Never Smoker  . Smokeless tobacco: Never Used  Substance Use Topics  . Alcohol use: Never  . Drug use: Never   Social History   Social History Narrative   Recently moved from [place], [place] to [place] area to be close to her daughter and son-in-law.   She currently lives with her husband (a retired Theme park manager).  Husband notes caregiver burnout issues.     She lives at [place] History family history includes Cancer in her brother, father, sister, and another family member; Stroke  in her mother.  OBJCTIVE -PE, EKG, labs   Wt Readings from Last 3 Encounters:  06/15/19 148 lb (67.1 kg)  11/14/18 170 lb (77.1 kg)    Physical Exam: BP (!) 161/72   Pulse (!) 59   Ht 5\' 6"  (1.676 m)  Wt 148 lb (67.1 kg)   SpO2 96%   BMI 23.89 kg/m  Physical Exam  Constitutional: She is oriented to person, place, and time. No distress.  Frail elderly woman, sitting in wheelchair.  She is conversant and fluent language, but has poor memory and often defers answering of questions to her daughter or husband.  HENT:  Head: Normocephalic and atraumatic.  Mouth/Throat: No oropharyngeal exudate (Fair dentition).  Mild temporal wasting  Eyes: EOM are normal.  Neck: No hepatojugular reflux and no JVD present. Carotid bruit is not present.  Cardiovascular: Regular rhythm, S1 normal and S2 normal.  Occasional extrasystoles are present. Tachycardia present. PMI is not displaced. Exam reveals distant heart sounds and decreased pulses (Mildly diminished pedal pulses). Exam reveals no gallop and no friction rub.  Murmur (1/6 SEM at RUSB) heard. Pulmonary/Chest: Effort normal and breath sounds normal. No respiratory distress. She has no wheezes. She has no rales. She exhibits tenderness (Some tenderness along the sternal border bilaterally.).  Abdominal: Soft. Bowel sounds are normal. She exhibits no distension. There is no abdominal tenderness. There is no rebound.  Somewhat scaphoid abdomen.  No HSM  Musculoskeletal:        General: No edema.     Cervical back: Normal range of motion and neck supple.     Comments: She is in a wheelchair, not currently walking much.  Walks around the house using cane but no more than 25 m.  Neurological: She is alert and oriented to person, place, and time.  Skin: She is not diaphoretic.  Psychiatric: She has a normal mood and affect.  Seems to fade in and out of the conversation.  Poor historian.  Decreased memory.  Vitals reviewed.  Conversant, alert and  oriented x2 (person and place) fluent language.  Poor memory.   Adult ECG Report  Rate: 59 ;  Rhythm: sinus tachycardia, sinus bradycardia and Otherwise normal axis, intervals and durations.;   Narrative Interpretation: Relatively normal EKG.  Recent Labs:   June 02, 2019  Na+ 141, K+ 4.1, Cl- 104, HCO3-30, BUN 17, Cr 0.7, Glu 90, Ca2+ 8.9; AST 19, ALT 11, AlkP 85; TSH 3.27.  CBC: W 4.8, H/H 12.9/37.3, Plt 217  TC 202, TG 82, HDL 68, LDL 119 No results found for: CHOL, HDL, LDLCALC, LDLDIRECT, TRIG, CHOLHDL  No results found for: TSH  ASSESSMENT/PLAN    Problem List Items Addressed This Visit    Coronary artery disease, non-occlusive (Chronic)    Sandra Cruz had a heart catheterization just over 2 years ago that showed moderate LAD disease with a completely negative FFR.  I do not think her chest pain episodes are related to CAD unless this may potentially spasm related.  She is on amlodipine which would potentially help if she is having spasm related chest pain. She is also on carvedilol for blood pressure and CAD.  She had previously been on pravastatin for hyperlipidemia, but that has been stopped, not unreasonable given her advanced age and dementia.  At this point I think the potential concern for worsening of dementia with statin warrants not using it.  Honestly we talked about quality of life issues and goals of care, I think at this point being aggressive with management is probably not the direction they wanted to go.  Probably would not want to do things like stress test that would lead to invasive procedures such as heart catheterizations.  Plan for now we will simply continue with beta-blocker and calcium channel blocker.  Aspirin if tolerated.      Relevant Orders   EKG 12-Lead   Essential hypertension (Chronic)    Her blood pressure is high today, but she is quite anxious about coming to see cardiologist.  She is on amlodipine and carvedilol.  To avoid concerns of  possible falls as she has had several falls within the past year, I would probably allow for some mild permissive hypertension and avoid being aggressive with management.  Her current blood pressure is probably at the upper limit of what I would consider acceptable for her, therefore not be more aggressive with treatment.      Relevant Orders   EKG 12-Lead   Hyperlipidemia, mixed (Chronic)    Clearly lipids are not at goal for someone with moderate CAD, but as mentioned elsewhere, would not want to be too aggressive with management.  Hope would be to avoid any complicating side effects of statins with her memory issues.  With her advanced age and dementia, I do not think it makes sense to be aggressive with management of risk factors for CAD which after 85 years was only moderate in the LAD.      Relevant Orders   EKG 12-Lead   Chest pain with low risk for cardiac etiology - Primary    The reason for referral was these intermittent episodes of chest pain which do not seem to be cardiac in nature is that there intermittent, random and without any associated factors.  They could perhaps be related to coronary spasm for which she is on amlodipine.  The very effective things are relieved by comfort measures such as a warm cup of coffee and Hershey's kisses which she thoroughly enjoys, I suspect that these episodes are probably more anxiety related with her dementia.  Would not want to go into any aggressive evaluations as they have been evaluated in the past with stress test and even a heart catheterization showing no significant findings.  I had a pretty long discussion with the husband and her daughter and to the extent that the patient even agrees that they would not want further evaluation with stress test or heart catheterizations.  Would simply continue to treat conservatively.  We could consider as needed nitroglycerin although this never really been helpful in the past.      Relevant Orders     EKG 12-Lead   Counseling regarding advance care planning and goals of care    Once we discussed her chest pain symptoms and inciting factors etc., it became obvious that Sandra Cruz and her family are not interested in invasive evaluations and procedures.  In fact they are more interested in her quality of life.  As such, I do not think significant evaluations with stress test echocardiograms etc. were unwanted as they would not want heart catheterization.  We also talked about goals of care when it comes to life support measures and resuscitation efforts.  I do not think they are quite ready to make the decision of DNR but it did seem like they were heading to the direction of agreed for DNR.  Explained to her that would be reasonable consideration to have, and I think this is something that should be discussed between her PCP and neurologist as well.         COVID-19 Education: The signs and symptoms of COVID-19 were discussed with the patient and how to seek care for testing (follow up with PCP or arrange E-visit).   The importance of social distancing  was discussed today.  I spent a total of 48minutes with the patient. >  50% of the time was spent in direct patient consultation.  Additional time spent with chart review  / charting (studies, outside notes, etc): 20 Total Time: 45 min   Current medicines are reviewed at length with the patient today.  (+/- concerns) none  --Delayed signing this note was due to technical issues with epic system.  But not able to sign upon completion of the note, at that time. The note became available to fully complete at this time.  Patient Instructions / Medication Changes & Studies & Tests Ordered   Patient Instructions  Medication Instructions:   no changes *If you need a refill on your cardiac medications before your next appointment, please call your pharmacy*  Lab Work: Not needed  Testing/Procedures:  not needed  Follow-Up: At [facility], you and your health needs are our priority.  As part of our continuing mission to provide you with exceptional heart care, we have created designated Provider Care Teams.  These Care Teams include your primary Cardiologist (physician) and Advanced Practice Providers (APPs -  Physician Assistants and Nurse Practitioners) who all work together to provide you with the care you need, when you need it.  Your next appointment:   6 month(s)  The format for your next appointment:   In Person  Provider:   Glenetta Hew, MD    Studies Ordered:   Orders Placed This Encounter  Procedures  . EKG 12-Lead     Glenetta Hew, M.D., M.S. Interventional Cardiologist   Pager # [phone] Phone # [phone] [address]. [place], [place] [address]   Thank you for choosing Heart[facility] at [facility]!!

## 2019-06-22 ENCOUNTER — Telehealth: Payer: Self-pay | Admitting: Neurology

## 2019-06-22 NOTE — Telephone Encounter (Signed)
I called patient and spoke with her daughter, Juliann Pulse, who was very adamant about keeping this appointment and seeing Dr. Jaynee Eagles. She is willing to do a mychart visit, however, so I will set the patient up and call patient's daughter back with the information later today.   FYI

## 2019-06-22 NOTE — Telephone Encounter (Signed)
Sandra Cruz patient is a stable dementia follow up. No need to expose her to Covid unnecessarily. Please call and reschedule her to [name] they can even do it by video in 3-4 months when things are hopefully better. thanks

## 2019-06-29 ENCOUNTER — Telehealth (INDEPENDENT_AMBULATORY_CARE_PROVIDER_SITE_OTHER): Payer: Medicare Other | Admitting: Neurology

## 2019-06-29 DIAGNOSIS — I2510 Atherosclerotic heart disease of native coronary artery without angina pectoris: Secondary | ICD-10-CM | POA: Diagnosis not present

## 2019-06-29 DIAGNOSIS — F0390 Unspecified dementia without behavioral disturbance: Secondary | ICD-10-CM

## 2019-06-29 NOTE — Progress Notes (Signed)
[facility]    Provider:  Dr Jaynee Eagles Requesting Provider: Orpah Melter, MD Primary Care Provider:  London Pepper, MD  CC:  Dementia  Virtual Visit via Video Note  I connected with Sandra Cruz on 07/03/19 at  2:00 PM EST by a video enabled telemedicine application and verified that I am speaking with the correct person using two identifiers with her husband and daughter.  Location: Patient: Home Provider: office   I discussed the limitations of evaluation and management by telemedicine and the availability of in person appointments. The patient expressed understanding and agreed to proceed.  Follow Up Instructions:    I discussed the assessment and treatment plan with the patient. The patient was provided an opportunity to ask questions and all were answered. The patient agreed with the plan and demonstrated an understanding of the instructions.   The patient was advised to call back or seek an in-person evaluation if the symptoms worsen or if the condition fails to improve as anticipated.   Melvenia Beam, MD   Interval history June 29, 2019: Patient appears to be doing well, she is on the call with her husband and daughter, husband is concerned because in the middle of the night when she wakes up it appears as though she is more confused and does not recognize him.  But with gentle reassurance she does eventually come to realize she is at home with her husband.  I did discuss this at length with family, as long as he can reassure her that I think that this is fine.  We did discuss behavioral redirection.  Patient today says that she is doing fine.  Daughters are there to help.  They also have daily nursing care that helps patient with bathing and other ADLs.  At this point she is on Aricept and Namenda, we did discuss that currently there is no other treatments.  She can follow-up with [place] in 6 months or a year but she can also be return to primary  care.  HPI:  Sandra Cruz is a 86 y.o. female here as requested by Orpah Melter, MD for dementia.  Past medical history of hypertension, dementia, osteoarthritis of the knee, macular degeneration.  Patient is already on Aricept and Namenda.They live at [facility]. She is happy. They have been married for 46 years. We discussed dementia and medications. Husband is a Theme park manager. Daughter and husband are here. Daughter has moved family close to her to help them. Husband expressed his frustration and difficulties being a caretaker, we discussed caretaker burnout and I provided community resources for them and advised to get husband more help or maybe go from independent living to assisted care for them. Daughter involved in conversation and appears very involved and understands concerns. Will request prior neurology notes.   Reviewed notes, labs and imaging from outside physicians, which showed:  I reviewed notes from Dr. Bjorn Loser.  She recently moved from [facility].  She is here to establish care.  Neurology has diagnosed her with dementia.  She is taking Aricept and family has seen improvement in her memory issues since Namenda was added.  She will continue Aricept and Namenda. She is also on vitamin B12.  She is also on mirtazapine.  She has had falls in the past.  She has had a hip fracture.  She also has had anxiety and depression.  She has become more forgetful and anxious.  I reviewed labs from July 07, 2018 which included  BUN of 11 and creatinine of 0.64, alk phos was slightly elevated, CO2 slightly elevated otherwise quite unremarkable.    Review of Systems: Patient complains of symptoms per HPI as well as the following symptoms: dementia. Pertinent negatives and positives per HPI. All others negative.   Social History   Socioeconomic History  . Marital status: Married    Spouse name: Not on file  . Number of children: 3  . Years of education: Not on file  . Highest  education level: Master's degree (e.g., MA, MS, MEng, MEd, MSW, MBA)  Occupational History  . Not on file  Tobacco Use  . Smoking status: Never Smoker  . Smokeless tobacco: Never Used  Substance and Sexual Activity  . Alcohol use: Never  . Drug use: Never  . Sexual activity: Not on file  Other Topics Concern  . Not on file  Social History Narrative   Recently moved from [place], [place] to [place] area to be close to her daughter and son-in-law.   She currently lives with her husband (a retired Theme park manager).  Husband notes caregiver burnout issues.      Social Determinants of Health   Financial Resource Strain:   . Difficulty of Paying Living Expenses: Not on file  Food Insecurity:   . Worried About Charity fundraiser in the Last Year: Not on file  . Ran Out of Food in the Last Year: Not on file  Transportation Needs:   . Lack of Transportation (Medical): Not on file  . Lack of Transportation (Non-Medical): Not on file  Physical Activity:   . Days of Exercise per Week: Not on file  . Minutes of Exercise per Session: Not on file  Stress:   . Feeling of Stress : Not on file  Social Connections:   . Frequency of Communication with Friends and Family: Not on file  . Frequency of Social Gatherings with Friends and Family: Not on file  . Attends Religious Services: Not on file  . Active Member of Clubs or Organizations: Not on file  . Attends Archivist Meetings: Not on file  . Marital Status: Not on file  Intimate Partner Violence:   . Fear of Current or Ex-Partner: Not on file  . Emotionally Abused: Not on file  . Physically Abused: Not on file  . Sexually Abused: Not on file    Family History  Problem Relation Age of Onset  . Stroke Mother   . Cancer Father   . Cancer Sister   . Cancer Brother   . Cancer Other        2 sisters and several brothers    Past Medical History:  Diagnosis Date  . Breast cancer ([place])    Reportedly admission  . Dementia ([place])   .  Hip fracture requiring operative repair, right, sequela 04/2018   Likely related to fall- s/p ORIF-right femur fracture  . Hypertension   . Macular degeneration   . Osteoarthritis (arthritis due to wear and tear of joints)    Hips and knees as well as hands  . Osteoporosis   . Pelvic fracture ([place]) 11/14/2018   and coccyx; late the fall.  Medical management    Patient Active Problem List   Diagnosis Date Noted  . Coronary artery disease, non-occlusive 07/02/2019  . Essential hypertension 07/02/2019  . Chest pain with low risk for cardiac etiology 07/02/2019  . Hyperlipidemia, mixed 07/02/2019  . Counseling regarding advance care planning and goals of care 07/02/2019  .  Dementia without behavioral disturbance ([place]) 12/28/2018    Past Surgical History:  Procedure Laterality Date  . BREAST LUMPECTOMY     malignant  . cyst removal     from lower back, rods placed  . LEFT HEART CATH AND CORONARY ANGIOGRAPHY  03/2017   [facility]): EF 55%; large LAD (diffuse moderate disease) with 3 diagonal branches.  D2 and D3 are small; #1 large caliber LCx with 3 OM branches (OM1 and OM 2 are small, OM 3 medium caliber main branch with minimal disease.  Large dominant RCA with moderate proximal disease, mid 30 to 40%.  Medium sized PDA with minimal disease; FFR 50% mLAD - 0.96* NOT SIGNIFICANT  . NM MYOVIEW LTD  06/2014   [facility]: No evidence of ischemia or infarction.  [name] TOTAL HIP ARTHROPLASTY     broken femur, R side, repaired  . TRANSTHORACIC ECHOCARDIOGRAM  05/10/2018    LVEF 60 to 65%.  GR 1 DD.  Normal RV.  Mild RA and mild to moderate LA dilation.  Sclerotic aortic valve with no stenosis.    Current Outpatient Medications  Medication Sig Dispense Refill  . amLODipine (NORVASC) 2.5 MG tablet Take 2.5 mg by mouth 2 (two) times a day.    . Calcium-Magnesium-Zinc (CAL-MAG-ZINC PO) Take 1 tablet by mouth daily at 12 noon. Calcium 650 mg Magnesium 200 mg  Zinc 10 mg     . carvedilol (COREG) 3.125 MG tablet Take 3.125 mg by mouth 2 (two) times a day.    . Cranberry-Vitamin C (AZO CRANBERRY URINARY TRACT PO) Take 2 tablets by mouth daily.    [name] docusate sodium (COLACE) 100 MG capsule Take 100 mg by mouth as needed for mild constipation.    [name] donepezil (ARICEPT) 10 MG tablet Take 10 mg by mouth every evening.    . Ferrous Sulfate (IRON PO) Take 65 mg by mouth daily at 12 noon.    . Ginger, Zingiber officinalis, (GINGER ROOT) 550 MG CAPS Take 1 each by mouth daily at 12 noon.    . Ginkgo Biloba (GNP GINGKO BILOBA EXTRACT PO) Take 120 mg by mouth daily at 12 noon.    . Melatonin 3 MG TABS Take 4.5 mg by mouth at bedtime.    . memantine (NAMENDA) 5 MG tablet Take 10 mg by mouth 2 (two) times a day.    . Multiple Vitamin (MULTIVITAMIN PO) 1 tablet at noon. Brand: Life extension-High Potency Multivitamin and Mineral Supplement    . naproxen sodium (ALEVE) 220 MG tablet Take 440 mg by mouth daily as needed (pain).    . Omega-3 Fatty Acids (FISH OIL) 500 MG CAPS Take 1 each by mouth daily at 12 noon.    . Probiotic Product (PROBIOTIC PO) Take 1 tablet by mouth daily at 12 noon. 15 strains/25 billion microorganisms    . senna (SENOKOT) 8.6 MG TABS tablet Take 1 tablet by mouth as needed for mild constipation.    . sodium chloride 1 g tablet Take 1 g by mouth daily.    . traMADol (ULTRAM) 50 MG tablet Take 1 tablet (50 mg total) by mouth every 6 (six) hours as needed. 15 tablet 0  . UNABLE TO FIND Take 1 g by mouth daily at 12 noon. Med Name: Salt Tablet     No current facility-administered medications for this visit.    Allergies as of 06/29/2019 - Review Complete 06/15/2019  Allergen Reaction Noted  . Codeine Shortness Of Breath 01/01/2012  .  Penicillins Swelling and Rash 12/29/2012  . Latex Other (See Comments) 11/14/2018    Vitals: There were no vitals taken for this visit. Last Weight:  Wt Readings from Last 1 Encounters:  06/15/19 148 lb (67.1 kg)    Last Height:   Ht Readings from Last 1 Encounters:  06/15/19 5' 6"  (1.676 m)     Physical exam: Exam: Gen: NAD, conversant, well nourised, well groomed      CV: RRR, no MRG. No Carotid Bruits. No peripheral edema, warm, nontender Eyes: Conjunctivae clear without exudates or hemorrhage  Neuro: Detailed Neurologic Exam  Speech:    Speech is normal; fluent  Cognition:    The patient is oriented to person     recent and remote memory impaired;     language fluent;     Impaired attention, concentration, fund of knowledge Cranial Nerves:    The pupils are equal, round, and reactive to light. Attempted fundoscopic exam could not visualize.  Visual fields are full to finger confrontation. Extraocular movements are intact. Trigeminal sensation is intact and the muscles of mastication are normal. The face is symmetric. The palate elevates in the midline. Hearing intact. Voice is normal. Shoulder shrug is normal. The tongue has normal motion without fasciculations.   Coordination:    No dysmetria noted   Gait:    In a wheelchair, attempted but fall risk  Motor Observation:    No asymmetry, no atrophy, and no involuntary movements noted. Tone:    Normal muscle tone.    Posture:    Posture is normal in wheelchair    Strength:    Strength is equal and symmetric difficult exam due to dementia no focal deficits noted     Sensation: intact to LT     Reflex Exam:  DTR's:    Deep tendon reflexes in the upper and lower extremities are symmetrical bilaterally.   Toes:    The toes are equiv bilaterally.   Clonus:    Clonus is absent.    Assessment/Plan:  86 y.o. female here as requested by Orpah Melter, MD for dementia.  Past medical history of hypertension, dementia, osteoarthritis of the knee, macular degeneration.  Patient is already on Aricept and Namenda. Has established diagnosis of dementia and had a prior neurologist, establishing care in the area just  moved.  -Patient has established dementia.  Continue Aricept and Namenda.  She can be return to primary care however if they feel better see neurology every 6 months or year were happy to continue.  - fall precautions.  Today's history and physical demonstrated very substantial and measurable cognitive losses consistent with dementia. It is clear that she does not have the capacity to make informed and appropriate decisions on her healthcare and finances. I do recommend that she lives in a structured setting and at a higher level of care than she is now. It is also clear that patient does not comprehend the degree of cognitive losses. Patient is suffering from substantial cognitive impairment due to dementia.   Cc: Orpah Melter, MD   A total of 15 minutes was spent video face-to-face with this patient. Over half this time was spent on counseling patient on the  1. Dementia without behavioral disturbance, unspecified dementia type ([place])    diagnosis and different diagnostic and therapeutic options, counseling and coordination of care, risks ans benefits of management, compliance, or risk factor reduction and education.     Sarina Ill, MD  [facility] [address]  [place], [place] [address]  Phone [phone] Fax [phone]

## 2019-07-02 ENCOUNTER — Encounter: Payer: Self-pay | Admitting: Cardiology

## 2019-07-02 DIAGNOSIS — Z7189 Other specified counseling: Secondary | ICD-10-CM | POA: Insufficient documentation

## 2019-07-02 DIAGNOSIS — I2510 Atherosclerotic heart disease of native coronary artery without angina pectoris: Secondary | ICD-10-CM | POA: Insufficient documentation

## 2019-07-02 DIAGNOSIS — R079 Chest pain, unspecified: Secondary | ICD-10-CM | POA: Insufficient documentation

## 2019-07-02 DIAGNOSIS — I10 Essential (primary) hypertension: Secondary | ICD-10-CM | POA: Insufficient documentation

## 2019-07-02 DIAGNOSIS — E782 Mixed hyperlipidemia: Secondary | ICD-10-CM | POA: Insufficient documentation

## 2019-07-02 DIAGNOSIS — M940 Chondrocostal junction syndrome [Tietze]: Secondary | ICD-10-CM | POA: Insufficient documentation

## 2019-07-02 NOTE — Assessment & Plan Note (Signed)
Her blood pressure is high today, but she is quite anxious about coming to see cardiologist.  She is on amlodipine and carvedilol.  To avoid concerns of possible falls as she has had several falls within the past year, I would probably allow for some mild permissive hypertension and avoid being aggressive with management.  Her current blood pressure is probably at the upper limit of what I would consider acceptable for her, therefore not be more aggressive with treatment.

## 2019-07-02 NOTE — Assessment & Plan Note (Signed)
Once we discussed her chest pain symptoms and inciting factors etc., it became obvious that Sandra Cruz and her family are not interested in invasive evaluations and procedures.  In fact they are more interested in her quality of life.  As such, I do not think significant evaluations with stress test echocardiograms etc. were unwanted as they would not want heart catheterization.  We also talked about goals of care when it comes to life support measures and resuscitation efforts.  I do not think they are quite ready to make the decision of DNR but it did seem like they were heading to the direction of agreed for DNR.  Explained to her that would be reasonable consideration to have, and I think this is something that should be discussed between her PCP and neurologist as well.

## 2019-07-02 NOTE — Assessment & Plan Note (Signed)
Rod Holler had a heart catheterization just over 2 years ago that showed moderate LAD disease with a completely negative FFR.  I do not think her chest pain episodes are related to CAD unless this may potentially spasm related.  She is on amlodipine which would potentially help if she is having spasm related chest pain. She is also on carvedilol for blood pressure and CAD.  She had previously been on pravastatin for hyperlipidemia, but that has been stopped, not unreasonable given her advanced age and dementia.  At this point I think the potential concern for worsening of dementia with statin warrants not using it.  Honestly we talked about quality of life issues and goals of care, I think at this point being aggressive with management is probably not the direction they wanted to go.  Probably would not want to do things like stress test that would lead to invasive procedures such as heart catheterizations.  Plan for now we will simply continue with beta-blocker and calcium channel blocker.  Aspirin if tolerated.

## 2019-07-02 NOTE — Assessment & Plan Note (Signed)
Clearly lipids are not at goal for someone with moderate CAD, but as mentioned elsewhere, would not want to be too aggressive with management.  Hope would be to avoid any complicating side effects of statins with her memory issues.  With her advanced age and dementia, I do not think it makes sense to be aggressive with management of risk factors for CAD which after 85 years was only moderate in the LAD.

## 2019-07-02 NOTE — Assessment & Plan Note (Signed)
The reason for referral was these intermittent episodes of chest pain which do not seem to be cardiac in nature is that there intermittent, random and without any associated factors.  They could perhaps be related to coronary spasm for which she is on amlodipine.  The very effective things are relieved by comfort measures such as a warm cup of coffee and Hershey's kisses which she thoroughly enjoys, I suspect that these episodes are probably more anxiety related with her dementia.  Would not want to go into any aggressive evaluations as they have been evaluated in the past with stress test and even a heart catheterization showing no significant findings.  I had a pretty long discussion with the husband and her daughter and to the extent that the patient even agrees that they would not want further evaluation with stress test or heart catheterizations.  Would simply continue to treat conservatively.  We could consider as needed nitroglycerin although this never really been helpful in the past.

## 2019-07-03 ENCOUNTER — Encounter: Payer: Self-pay | Admitting: Neurology

## 2019-09-03 IMAGING — CT CT PELVIS WITHOUT CONTRAST
2 of 3 series · 17 of 46 positions shown, 19 images · non-contrast
Comparison: None.

CLINICAL DATA: Fall from recliner.

EXAM:
CT PELVIS WITHOUT CONTRAST
TECHNIQUE: Multidetector CT imaging of the pelvis was performed following the
standard protocol without intravenous contrast.

[Series 3: axial st · axial · 0.98mm/px · z∈[-268,+66]mm · 14 of 193 slices shown, 16 images]
[im 13/193  soft-tissue]
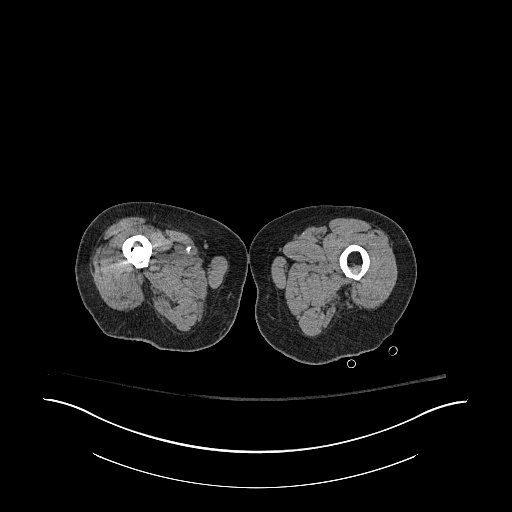
[im 13/193  bone]
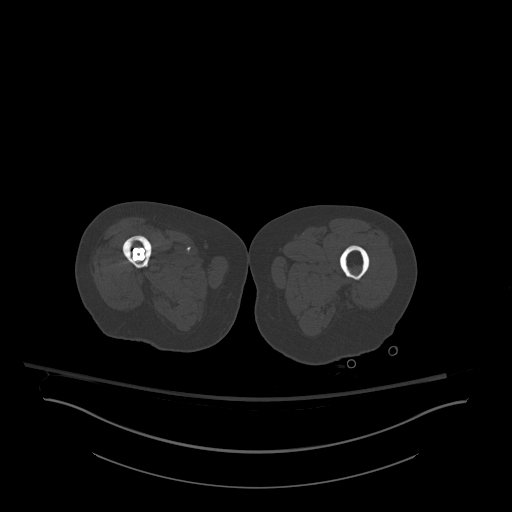
[im 25/193  soft-tissue]
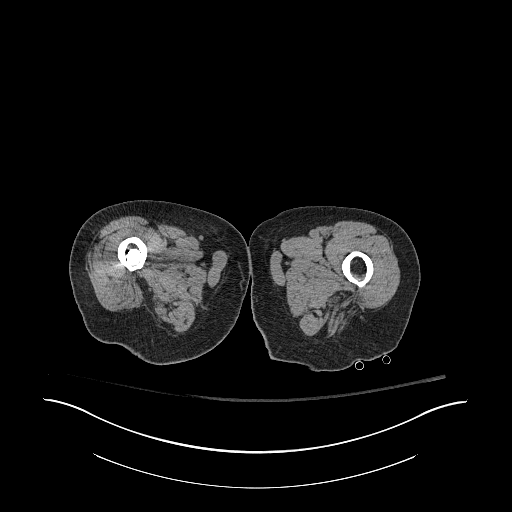
[im 38/193  soft-tissue]
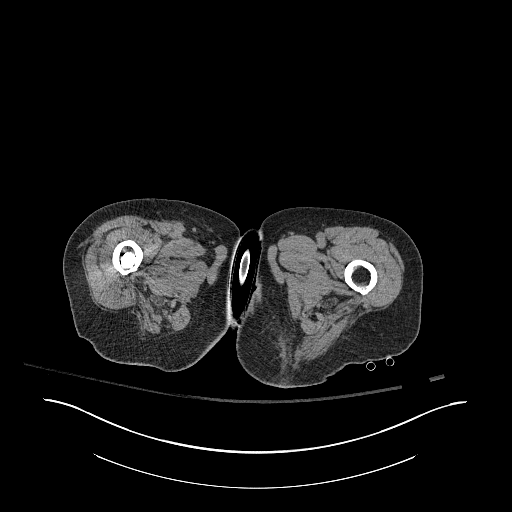
[im 50/193  soft-tissue]
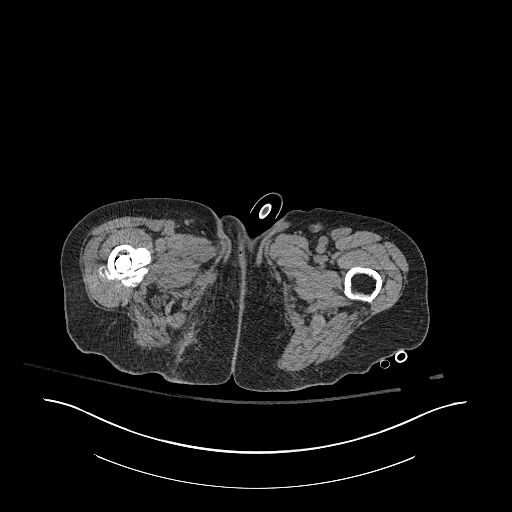
[im 62/193  soft-tissue]
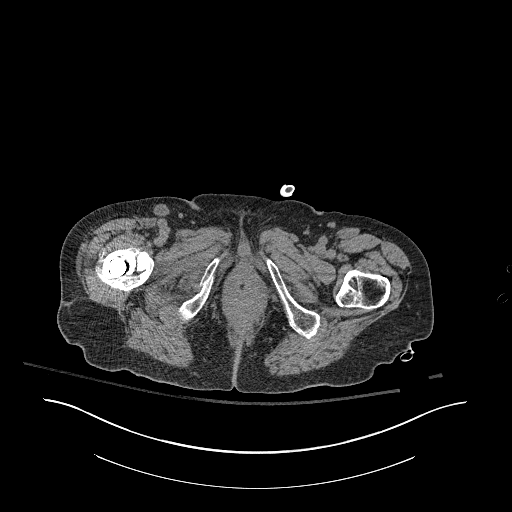
[im 75/193  soft-tissue]
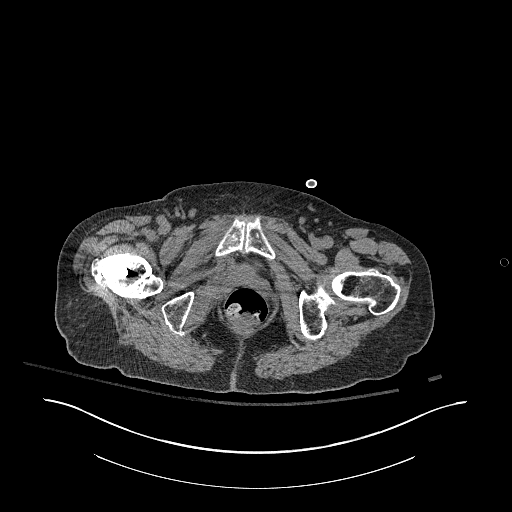
[im 87/193  soft-tissue]
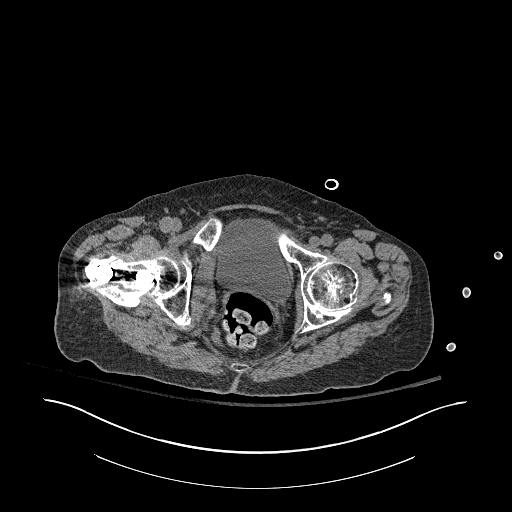
[im 106/193  soft-tissue]
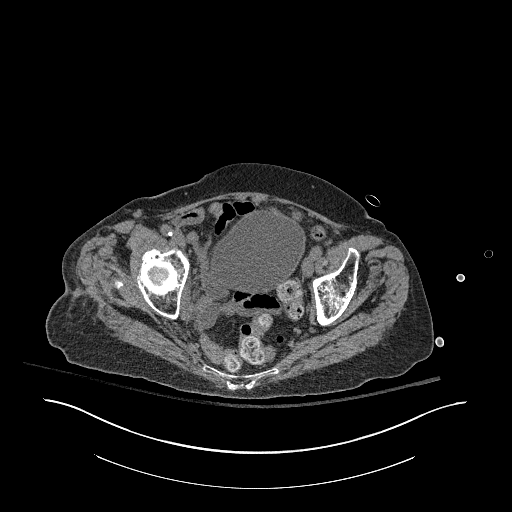
[im 118/193  soft-tissue]
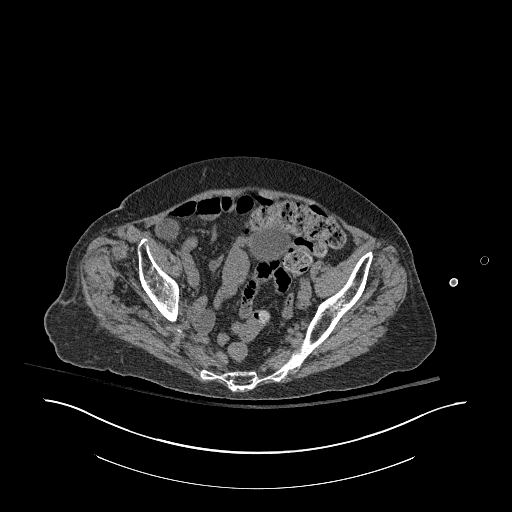
[im 118/193  bone]
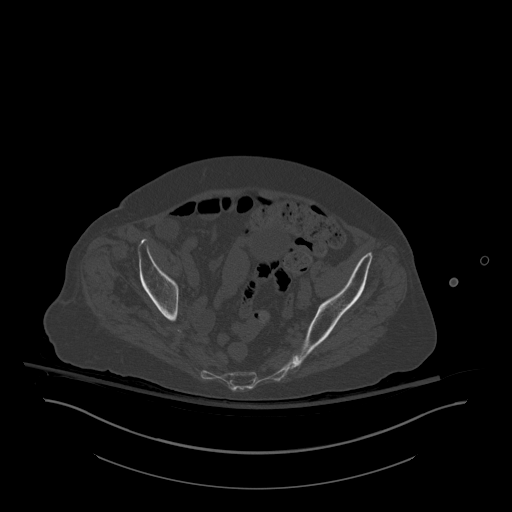
[im 131/193  soft-tissue]
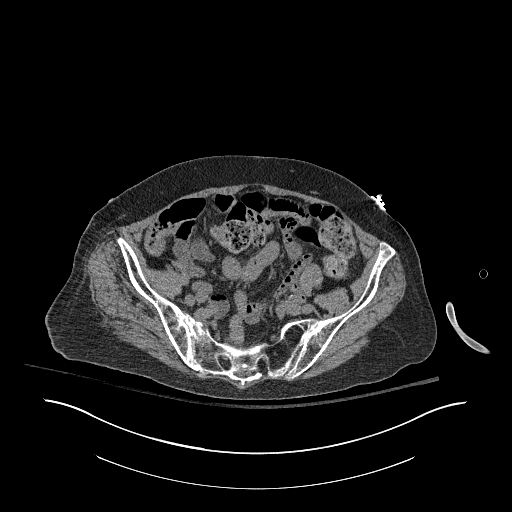
[im 143/193  soft-tissue]
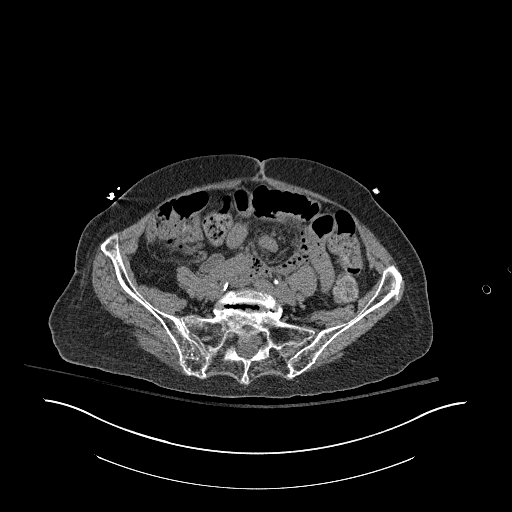
[im 155/193  soft-tissue]
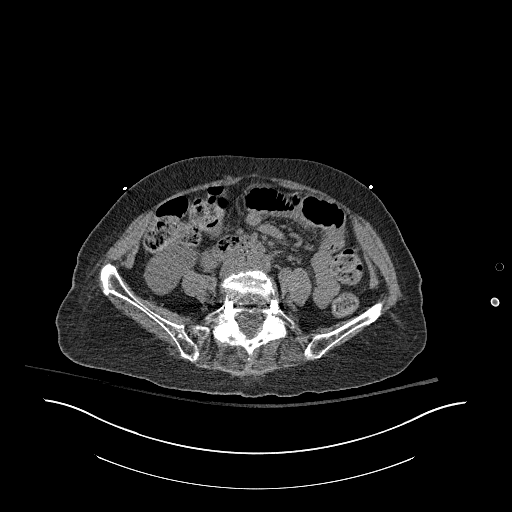
[im 168/193  soft-tissue]
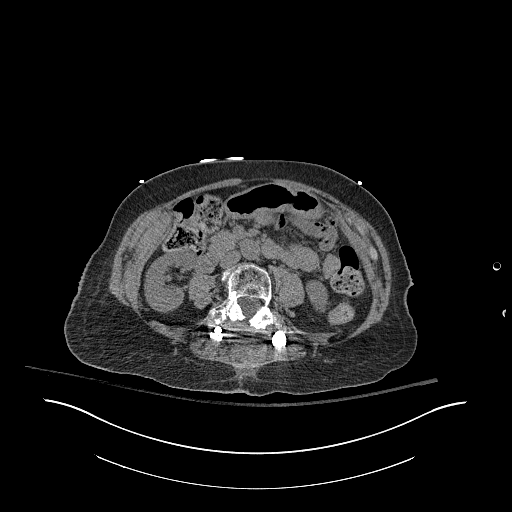
[im 180/193  soft-tissue]
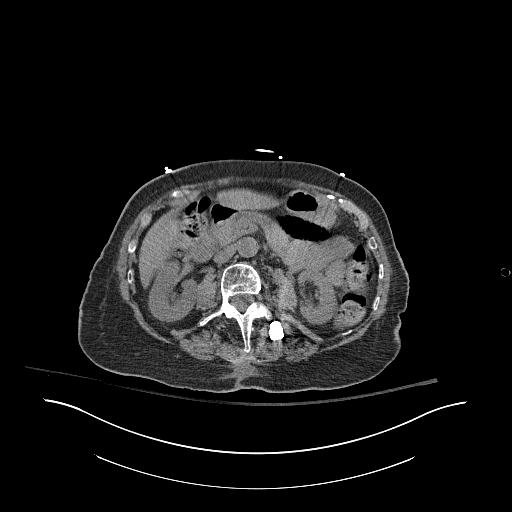

[Series 8: coronal st · coronal · 0.75mm/px · 3 of 125 slices shown]
[im 42/125  soft-tissue]
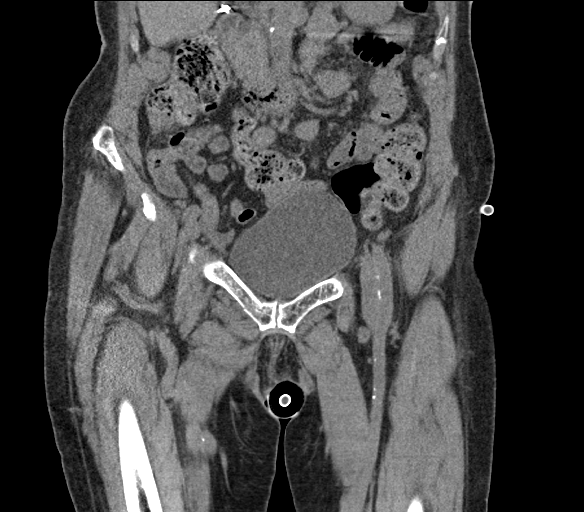
[im 56/125  soft-tissue]
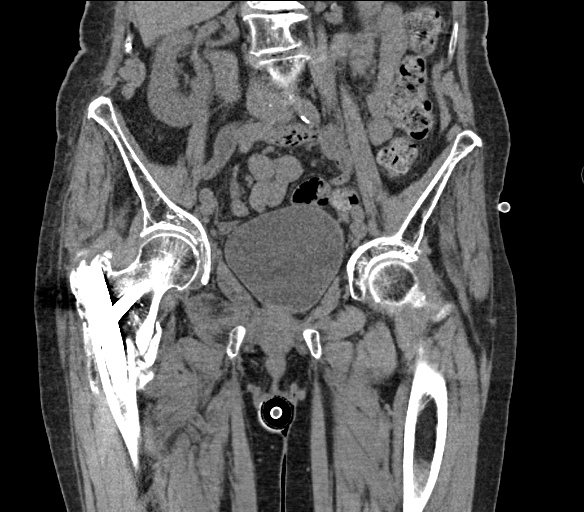
[im 69/125  soft-tissue]
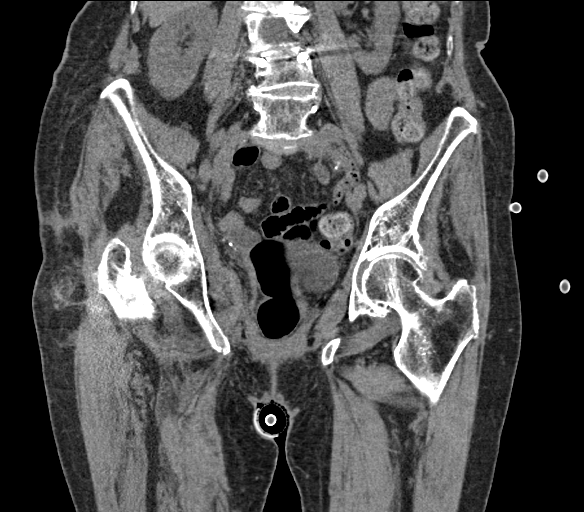

[17 of 46 positions shown; findings below may reference images not displayed]

FINDINGS: Distinct and acute appearing fractures at the right inferior pubic
ramus, right puboacetabular junction, and medial right inferior
pubic ramus. Right-sided sacral insufficiency fracture without
displacement or sacral foramina involvement. Intertrochanteric femur
fracture on the right with ORIF. Much of the fracture lucency is
still visible but there is bridging callus inferiorly.

No acute soft tissue finding. No evidence of visceral injury within
the pelvis and lower abdomen.
IMPRESSION: 1. Acute appearing fractures of the right inferior and superior
pubic rami.
2. Nondisplaced right sacral insufficiency fracture.
3. Non acute intertrochanteric right femur fracture with repair.
Much of the fracture lucency is still visible but there is bridging
callus inferiorly.

## 2019-11-29 ENCOUNTER — Ambulatory Visit: Payer: Medicare Other | Admitting: Cardiology

## 2019-12-25 ENCOUNTER — Other Ambulatory Visit: Payer: Self-pay

## 2019-12-25 ENCOUNTER — Encounter (INDEPENDENT_AMBULATORY_CARE_PROVIDER_SITE_OTHER): Payer: Medicare Other | Admitting: Ophthalmology

## 2019-12-25 DIAGNOSIS — H43813 Vitreous degeneration, bilateral: Secondary | ICD-10-CM | POA: Diagnosis not present

## 2019-12-25 DIAGNOSIS — H353231 Exudative age-related macular degeneration, bilateral, with active choroidal neovascularization: Secondary | ICD-10-CM | POA: Diagnosis not present

## 2019-12-28 ENCOUNTER — Encounter: Payer: Self-pay | Admitting: Cardiology

## 2019-12-28 ENCOUNTER — Other Ambulatory Visit: Payer: Self-pay

## 2019-12-28 ENCOUNTER — Ambulatory Visit (INDEPENDENT_AMBULATORY_CARE_PROVIDER_SITE_OTHER): Payer: Medicare Other | Admitting: Cardiology

## 2019-12-28 VITALS — BP 130/70 | HR 59 | Ht 65.0 in | Wt 150.0 lb

## 2019-12-28 DIAGNOSIS — I2510 Atherosclerotic heart disease of native coronary artery without angina pectoris: Secondary | ICD-10-CM

## 2019-12-28 DIAGNOSIS — E782 Mixed hyperlipidemia: Secondary | ICD-10-CM | POA: Diagnosis not present

## 2019-12-28 DIAGNOSIS — M940 Chondrocostal junction syndrome [Tietze]: Secondary | ICD-10-CM

## 2019-12-28 DIAGNOSIS — I10 Essential (primary) hypertension: Secondary | ICD-10-CM | POA: Diagnosis not present

## 2019-12-28 NOTE — Progress Notes (Signed)
Primary Care Provider: London Pepper, MD Cardiologist: No primary care provider on file. Electrophysiologist: None Neurologist: Dr. Hilaria Ota: Dr. Alvan Dame  None Clinic Note: Chief Complaint  Patient presents with  . Follow-up  . Chest Pain  . Shortness of Breath    HPI:    Sandra Cruz - "Sandra Cruz" is a 86 y.o. female with a PMH notable for hypertension, dementia and history of breast cancer with osteoarthritis and nonobstructive CAD who is being seen today for follow-up of chest pain.    She had previously been followed by [facility]-last seen in August 2019.  Most recent carotid evaluation was with an echocardiogram in December 2019.  She had a heart cath in 2018 that showed moderate, nonobstructive CAD.  Sandra Cruz is an 86 year old woman with moderate dementia who was evaluated back in January for chest pain.  Her husband is a patient of mine recently established follow-up of CAD.  Initial consult was at the request of London Pepper, MD. --She is accompanied by and daughter Sandra Cruz, phone [phone]) who is the major caregiver despite the fact that they live in an independent living facility.  Her husband was here today but in a separate room for clinic visit.  She was last seen on June 15, 2019 for evaluation of chest discomfort -> these episodes of dyspnea and chest pain occurred at random with no association with activity or lying down at [facility].  They tended to occur with sensation of dyspnea almost hyperventilation..  The treatment of choice that her daughter has initiated has been to her she is cases, a cup of tea, changing into her pajamas and taking a nap.  Usually by the time she has her cup of tea or coffee and is changed into pajamas, she feels better.  She is not very active, bothered by hip and knee pain.  She has been working with physical therapy and try to do some work on antigravity treadmill that allows her to walk without losing balance and without  fear of falling.  She had had a fall back in June 2020 had a pelvic fracture.  Recent Hospitalizations:   None  Reviewed  CV studies:    The following studies were reviewed today: (if available, images/films reviewed: From Epic Chart or Care Everywhere) -Reports noted from care [place]  None   Interval History:   Sandra Cruz presents here her first follow-up evaluation again accompanied by her daughter, Sandra Cruz.  She is not very talkative today, usually her daughters when answers questions.  She is working with physical therapy on the antigravity treadmill and doing okay.  With this she is not having any chest pain or pressure.  She still has these episodes of chest discomfort, but not with the same frequency.  About a week ago she had a spell which was the first that she had in a while.  And this was because she had a poor night sleep.  Usually if she sleeps well, she does not have this chest discomfort.  This was the first time in a month that she had 1 and she just had a bad night of sleep.  Her knees are bothering her.  The episode that she had said about an hour and they changed her out of her regular clothes and bra, put her in her pajamas and started to feel better immediately.  Once she relaxed and decrease the time breathing she felt fine.  Other than the spell, she  really has not had too many spells since I saw her last.  She now is able to point where her chest feels uncomfortable and is usually along both sides of her sternum as well as going along both breasts to the side.  She is not really noticing any of this symptom with exertion.  She may get little short of breath when she is doing her physical therapy but that is not a new thing.  She has mild lower extremity swelling but is sitting a lot and is not very active.  No PND orthopnea.  Improved palpitations  CV Review of Symptoms (Summary): positive for - chest pain, dyspnea on exertion, shortness of breath and  Rare chest pain now. negative for - dyspnea on exertion, edema, irregular heartbeat, orthopnea, paroxysmal nocturnal dyspnea, rapid heart rate or Syncope/near syncope or TIA/amaurosis fugax, claudication  The patient does not have symptoms concerning for COVID-19 infection (fever, chills, cough, or new shortness of breath).  The patient is practicing social distancing & Masking.    REVIEWED OF SYSTEMS   A comprehensive ROS was performed. Review of Systems  Constitutional: Positive for malaise/fatigue (May be little more lack of desire.). Negative for weight loss (Has stabilized a little bit.).  HENT: Negative for nosebleeds.   Respiratory: Negative for cough and shortness of breath (Per HPI).   Cardiovascular: Negative for leg swelling (She does occasionally get swelling on the right side since her fracture.).  Gastrointestinal: Negative for blood in stool, heartburn and melena.  Genitourinary: Positive for frequency. Negative for hematuria.  Musculoskeletal: Positive for back pain and joint pain. Negative for falls (None reported since June).       Has become quite debilitated since her fall in June compounding the fall last December 2019  Neurological: Positive for dizziness and weakness (Generalized). Negative for seizures, loss of consciousness and headaches.  Endo/Heme/Allergies: Negative for environmental allergies.  Psychiatric/Behavioral: Positive for memory loss. Negative for depression (Cannot tell if she is depressed, or this is mostly dementia.) and hallucinations (Not really hallucinations, she knows things are not real.). The patient is nervous/anxious and has insomnia (When she does not sleep well, is what she usually has chest pain).    I have reviewed and (if needed) personally updated the patient's problem list, medications, allergies, past medical and surgical history, social and family history.   PAST MEDICAL HISTORY   Past Medical History:  Diagnosis Date  . Breast  cancer ([place])    Reportedly admission  . Dementia ([place])   . Hip fracture requiring operative repair, right, sequela 04/2018   Likely related to fall- s/p ORIF-right femur fracture  . Hypertension   . Macular degeneration   . Osteoarthritis (arthritis due to wear and tear of joints)    Hips and knees as well as hands  . Osteoporosis   . Pelvic fracture ([place]) 11/14/2018   and coccyx; late the fall.  Medical management   December 2019 right hip arthroplasty for malrotation and hip pain.  PAST SURGICAL HISTORY   Past Surgical History:  Procedure Laterality Date  . BREAST LUMPECTOMY     malignant  . cyst removal     from lower back, rods placed  . LEFT HEART CATH AND CORONARY ANGIOGRAPHY  03/2017   [facility]): EF 55%; large LAD (diffuse moderate disease) with 3 diagonal branches.  D2 and D3 are small; #1 large caliber LCx with 3 OM branches (OM1 and OM 2 are small, OM 3 medium caliber main  branch with minimal disease.  Large dominant RCA with moderate proximal disease, mid 30 to 40%.  Medium sized PDA with minimal disease; FFR 50% mLAD - 0.96* NOT SIGNIFICANT  . NM MYOVIEW LTD  06/2014   [facility]: No evidence of ischemia or infarction.  [name] TOTAL HIP ARTHROPLASTY     broken femur, R side, repaired  . TRANSTHORACIC ECHOCARDIOGRAM  05/10/2018    LVEF 60 to 65%.  GR 1 DD.  Normal RV.  Mild RA and mild to moderate LA dilation.  Sclerotic aortic valve with no stenosis.    MEDICATIONS/ALLERGIES   Current Meds  Medication Sig  . amLODipine (NORVASC) 2.5 MG tablet Take 2.5 mg by mouth 2 (two) times a day.  . carvedilol (COREG) 3.125 MG tablet Take 3.125 mg by mouth 2 (two) times a day.  . Cranberry-Vitamin C (AZO CRANBERRY URINARY TRACT PO) Take 2 tablets by mouth daily.  [name] docusate sodium (COLACE) 100 MG capsule Take 100 mg by mouth as needed for mild constipation.  [name] donepezil (ARICEPT) 10 MG tablet Take 10 mg by mouth every evening.  . Ginger, Zingiber officinalis,  (GINGER ROOT) 550 MG CAPS Take 1 each by mouth daily at 12 noon.  . Ginkgo Biloba (GNP GINGKO BILOBA EXTRACT PO) Take 120 mg by mouth daily at 12 noon.  . Melatonin 3 MG TABS Take 4.5 mg by mouth at bedtime.  . memantine (NAMENDA) 5 MG tablet Take 10 mg by mouth 2 (two) times a day.  . Multiple Vitamin (MULTIVITAMIN PO) 1 tablet at noon. Brand: Life extension-High Potency Multivitamin and Mineral Supplement  . naproxen sodium (ALEVE) 220 MG tablet Take 440 mg by mouth daily as needed (pain).  . Omega-3 Fatty Acids (FISH OIL) 500 MG CAPS Take 1 each by mouth daily at 12 noon.  [name] OVER THE COUNTER MEDICATION Calcium  . Probiotic Product (PROBIOTIC PO) Take 1 tablet by mouth daily at 12 noon. 15 strains/25 billion microorganisms  . senna (SENOKOT) 8.6 MG TABS tablet Take 1 tablet by mouth as needed for mild constipation.  . sodium chloride 1 g tablet Take 1 g by mouth daily.  . traMADol (ULTRAM) 50 MG tablet Take 1 tablet (50 mg total) by mouth every 6 (six) hours as needed.  . [DISCONTINUED] UNABLE TO FIND Take 1 g by mouth daily at 12 noon. Med Name: Salt Tablet    Allergies  Allergen Reactions  . Codeine Shortness Of Breath  . Penicillins Swelling and Rash    Did it involve swelling of the face/tongue/throat, SOB, or low BP? No Did it involve sudden or severe rash/hives, skin peeling, or any reaction on the inside of your mouth or nose? No Did you need to seek medical attention at a hospital or doctor's office? No When did it last happen?childhood If all above answers are "NO", may proceed with cephalosporin use.  . Latex Other (See Comments)    blisters    SOCIAL HISTORY/FAMILY HISTORY   Social History   Tobacco Use  . Smoking status: Never Smoker  . Smokeless tobacco: Never Used  Vaping Use  . Vaping Use: Never used  Substance Use Topics  . Alcohol use: Never  . Drug use: Never   Social History   Social History Narrative   Recently moved from [place], [place] to  [place] area to be close to her daughter and son-in-law.   She currently lives with her husband (a retired Theme park manager).  Husband notes caregiver burnout issues.  She lives at [place] History family history includes Cancer in her brother, father, sister, and another family member; Stroke in her mother.  OBJCTIVE -PE, EKG, labs   Wt Readings from Last 3 Encounters:  12/28/19 150 lb (68 kg)  06/15/19 148 lb (67.1 kg)  11/14/18 170 lb (77.1 kg)    Physical Exam: BP 130/70 (BP Location: Left Arm, Patient Position: Sitting, Cuff Size: Normal)   Cruz (!) 61   Ht 5\' 5"  (1.651 m)   Wt 150 lb (68 kg)   BMI 24.96 kg/m  Physical Exam Vitals reviewed.  Constitutional:      General: She is not in acute distress.    Appearance: She is well-developed. She is ill-appearing (Somewhat debilitated, but nontoxic).     Comments: Frail elderly woman, sitting in wheelchair.  She is conversant and fluent language, but has poor memory and often defers answering of questions to her daughter or husband.  HENT:     Head: Normocephalic and atraumatic.     Mouth/Throat:     Pharynx: Oropharyngeal exudate: Fair dentition.  Neck:     Vascular: No carotid bruit, hepatojugular reflux or JVD.  Cardiovascular:     Rate and Rhythm: Normal rate and regular rhythm. Occasional extrasystoles are present.    Chest Wall: PMI is not displaced.     Pulses: Decreased pulses (Palpable but weak pedal pulses).     Heart sounds: S1 normal and S2 normal. Heart sounds are distant. Murmur (Soft 1/6 SEM at RUSB.) heard.  No friction rub. No gallop.   Pulmonary:     Effort: Pulmonary effort is normal. No respiratory distress.     Breath sounds: Normal breath sounds. No wheezing or rales.  Chest:     Chest wall: Tenderness (Much more defined tenderness along mostly the left sternal border.  The second third costosternal joints are extremely tender with even using the stethoscope for auscultation.  Point  tenderness is clearly noted with her moving the hand away.) present.  Abdominal:     General: Bowel sounds are normal. There is no distension.     Palpations: Abdomen is soft.     Tenderness: There is no abdominal tenderness. There is no rebound.     Comments: Somewhat scaphoid abdomen.  No HSM  Musculoskeletal:     Cervical back: Normal range of motion and neck supple.     Right lower leg: Edema (Trivial) present.     Left lower leg: Edema (Trivial) present.     Comments: She is in a wheelchair, not currently walking much.  Walks around the house using cane but no more than 25 m.  Neurological:     General: No focal deficit present.     Mental Status: She is alert.     Comments: She is oriented to person and place, but not necessarily time  Psychiatric:        Mood and Affect: Mood normal. Mood is not anxious (She is not anxious today, but has a tendency to get anxious).        Behavior: Behavior normal. Behavior is not agitated.     Comments: Seems to fade in and out of the conversation.  Poor historian.  Decreased memory.    Conversant, alert and oriented x2 (person and place) fluent language.  Poor memory.   Adult ECG Report  Rate: 59 ;  Rhythm: sinus tachycardia, sinus bradycardia and Otherwise normal axis, intervals and durations.;   Narrative Interpretation: Relatively normal  EKG.  Recent Labs: No new labs May 01, 2019  Na+ 141, K+ 4.1, Cl- 104, HCO3-30, BUN 17, Cr 0.7, Glu 90, Ca2+ 8.9; AST 19, ALT 11, AlkP 85; TSH 3.27.  CBC: W 4.8, H/H 12.9/37.3, Plt 217  TC 202, TG 82, HDL 68, LDL 119 No results found for: CHOL, HDL, LDLCALC, LDLDIRECT, TRIG, CHOLHDL  No results found for: TSH  ASSESSMENT/PLAN    Problem List Items Addressed This Visit    Coronary artery disease, non-occlusive (Chronic)    She had relatively normal disease on her CAD couple years ago.  The symptoms she is having seem very consistent with costochondritis based on exam today. She is on  carvedilol and amlodipine for CAD. Because of memory issues, no longer on statin. With concern about falling, we discussed aspirin and probably will avoid for now.  Further discussion today would indicate that she probably would not be a candidate for invasive procedures and therefore we will not proceed with any ischemic evaluation.      Essential hypertension (Chronic)    She is much less anxious today as indicated by her blood pressure and heart rate being better controlled.  She is on stable dose of amlodipine and carvedilol which we will continue.      Costochondritis - Primary (Chronic)    On further evaluation today, she had clear point tenderness in the chest on examination which was pretty exquisite basely back she moved my stethoscope away.  This would go along well with paroxysmal chest pain that is not exertional.  I think naproxen or Aleve that she takes for her other arthritis pains would be reasonable treatment here.  Otherwise I think the tender loving care option of her she is guess is in a cup of coffee or tea is a great treatment option.  Her chest pain improving with taken off her bra strap would also go along with costochondritis.      Hyperlipidemia, mixed (Chronic)    As discussed, even though her lipids are not quite at goal.  I think that at this point we should consider adding a statin or other medication for lipid management.  I am concerned about her memory issues being complicated by being on a statin.  She would not be interested in any invasive procedures.  Her CAD was not that bad.  Per our discussion, her daughter I decided that having the medication would not be warranted.         COVID-19 Education: The signs and symptoms of COVID-19 were discussed with the patient and how to seek care for testing (follow up with PCP or arrange E-visit).   The importance of social distancing was discussed today.  I spent a total of 1minutes with the patient and her  daughter, in direct patient consultation.  Additional time spent with chart review  / charting (studies, outside notes, etc): 10 Total Time: 41min   Current medicines are reviewed at length with the patient today.  (+/- concerns) none   Patient Instructions / Medication Changes & Studies & Tests Ordered   Patient Instructions  Medication Instructions:   Continue to use your Naprosyn for this pain you are having.   *If you need a refill on your cardiac medications before your next appointment, please call your pharmacy*   Lab Work: Not needed If you have labs (blood work) drawn today and your tests are completely normal, you will receive your results only by: [name] MyChart Message (if you have MyChart)  OR . A paper copy in the mail If you have any lab test that is abnormal or we need to change your treatment, we will call you to review the results.   Testing/Procedures: Not needed   Follow-Up: At [facility], you and your health needs are our priority.  As part of our continuing mission to provide you with exceptional heart care, we have created designated Provider Care Teams.  These Care Teams include your primary Cardiologist (physician) and Advanced Practice Providers (APPs -  Physician Assistants and Nurse Practitioners) who all work together to provide you with the care you need, when you need it.  We recommend signing up for the patient portal called "MyChart".  Sign up information is provided on this After Visit Summary.  MyChart is used to connect with patients for Virtual Visits (Telemedicine).  Patients are able to view lab/test results, encounter notes, upcoming appointments, etc.  Non-urgent messages can be sent to your provider as well.   To learn more about what you can do with MyChart, go to [email or web address].    Your next appointment:   12 month(s)  The format for your next appointment:   In Person  Provider:   Glenetta Hew, MD   Other Instructions   the PTE is good . continue using it.  Studies Ordered:   No orders of the defined types were placed in this encounter.    Glenetta Hew, M.D., M.S. Interventional Cardiologist   Pager # [phone] Phone # [phone] [address] [place], [place] [address]   Thank you for choosing Heartcare at [facility]!!

## 2019-12-28 NOTE — Patient Instructions (Signed)
Medication Instructions:   Continue to use your Naprosyn for this pain you are having.   *If you need a refill on your cardiac medications before your next appointment, please call your pharmacy*   Lab Work: Not needed If you have labs (blood work) drawn today and your tests are completely normal, you will receive your results only by: [name] MyChart Message (if you have MyChart) OR . A paper copy in the mail If you have any lab test that is abnormal or we need to change your treatment, we will call you to review the results.   Testing/Procedures: Not needed   Follow-Up: At [facility], you and your health needs are our priority.  As part of our continuing mission to provide you with exceptional heart care, we have created designated Provider Care Teams.  These Care Teams include your primary Cardiologist (physician) and Advanced Practice Providers (APPs -  Physician Assistants and Nurse Practitioners) who all work together to provide you with the care you need, when you need it.  We recommend signing up for the patient portal called "MyChart".  Sign up information is provided on this After Visit Summary.  MyChart is used to connect with patients for Virtual Visits (Telemedicine).  Patients are able to view lab/test results, encounter notes, upcoming appointments, etc.  Non-urgent messages can be sent to your provider as well.   To learn more about what you can do with MyChart, go to [email or web address].    Your next appointment:   12 month(s)  The format for your next appointment:   In Person  Provider:   Glenetta Hew, MD   Other Instructions  the PTE is good . continue using it.

## 2020-01-02 ENCOUNTER — Encounter: Payer: Self-pay | Admitting: Cardiology

## 2020-01-02 NOTE — Assessment & Plan Note (Signed)
She had relatively normal disease on her CAD couple years ago.  The symptoms she is having seem very consistent with costochondritis based on exam today. She is on carvedilol and amlodipine for CAD. Because of memory issues, no longer on statin. With concern about falling, we discussed aspirin and probably will avoid for now.  Further discussion today would indicate that she probably would not be a candidate for invasive procedures and therefore we will not proceed with any ischemic evaluation.

## 2020-01-02 NOTE — Assessment & Plan Note (Signed)
As discussed, even though her lipids are not quite at goal.  I think that at this point we should consider adding a statin or other medication for lipid management.  I am concerned about her memory issues being complicated by being on a statin.  She would not be interested in any invasive procedures.  Her CAD was not that bad.  Per our discussion, her daughter I decided that having the medication would not be warranted.

## 2020-01-02 NOTE — Assessment & Plan Note (Addendum)
On further evaluation today, she had clear point tenderness in the chest on examination which was pretty exquisite basely back she moved my stethoscope away.  This would go along well with paroxysmal chest pain that is not exertional.  I think naproxen or Aleve that she takes for her other arthritis pains would be reasonable treatment here.  Otherwise I think the tender loving care option of her she is guess is in a cup of coffee or tea is a great treatment option.  Her chest pain improving with taken off her bra strap would also go along with costochondritis.

## 2020-01-02 NOTE — Assessment & Plan Note (Signed)
She is much less anxious today as indicated by her blood pressure and heart rate being better controlled.  She is on stable dose of amlodipine and carvedilol which we will continue.

## 2020-01-03 NOTE — Progress Notes (Addendum)
PATIENT: Sandra Cruz DOB: Dec 16, 1929  REASON FOR VISIT: follow up HISTORY FROM: patient  Chief Complaint  Patient presents with  . Follow-up    6 month f/u, states she has been doing well since last visit. Per family, memory has stayed the same.   . room 1    with daughter and husband (both manage her care)     HISTORY OF PRESENT ILLNESS: Today 01/04/20 Sandra Cruz is a 86 y.o. female here today for follow up for dementia. She continues Aricept and Namenda. Sandra Cruz (husband) and Sandra Cruz (daughter) are here with her today and aid in history. She continues to live in a retirement community. Overall, memory is stable. No major changes. She started PT in March. She feels that this is helping. She has been more active. She is eating and drinking normally, although, she does seem to enjoy comfort foods more than meats. She is able to perform most ADL's with minimal assistance. Sandra Cruz will dose medications for her. Meals are provided by the retirement community.    HISTORY: (copied from Dr Cathren Laine note on 06/29/2019)  Interval history June 29, 2019: Patient appears to be doing well, she is on the call with her husband and daughter, husband is concerned because in the middle of the night when she wakes up it appears as though she is more confused and does not recognize him.  But with gentle reassurance she does eventually come to realize she is at home with her husband.  I did discuss this at length with family, as long as he can reassure her that I think that this is fine.  We did discuss behavioral redirection.  Patient today says that she is doing fine.  Daughters are there to help.  They also have daily nursing care that helps patient with bathing and other ADLs.  At this point she is on Aricept and Namenda, we did discuss that currently there is no other treatments.  She can follow-up with [place] in 6 months or a year but she can also be return to primary care.  HPI:  Sandra Cruz is a 86 y.o.  female here as requested by Orpah Melter, MD for dementia.  Past medical history of hypertension, dementia, osteoarthritis of the knee, macular degeneration.  Patient is already on Aricept and Namenda.They live at [facility]. She is happy. They have been married for 24 years. We discussed dementia and medications. Husband is a Theme park manager. Daughter and husband are here. Daughter has moved family close to her to help them. Husband expressed his frustration and difficulties being a caretaker, we discussed caretaker burnout and I provided community resources for them and advised to get husband more help or maybe go from independent living to assisted care for them. Daughter involved in conversation and appears very involved and understands concerns. Will request prior neurology notes.   Reviewed notes, labs and imaging from outside physicians, which showed:  I reviewed notes from Dr. Bjorn Loser.  She recently moved from [facility].  She is here to establish care.  Neurology has diagnosed her with dementia.  She is taking Aricept and family has seen improvement in her memory issues since Namenda was added.  She will continue Aricept and Namenda. She is also on vitamin B12.  She is also on mirtazapine.  She has had falls in the past.  She has had a hip fracture.  She also has had anxiety and depression.  She has become more forgetful and  anxious.  I reviewed labs from July 07, 2018 which included BUN of 11 and creatinine of 0.64, alk phos was slightly elevated, CO2 slightly elevated otherwise quite unremarkable.     REVIEW OF SYSTEMS: Out of a complete 14 system review of symptoms, the patient complains only of the following symptoms, weakness, memory loss, right leg pain and all other reviewed systems are negative.   ALLERGIES: Allergies  Allergen Reactions  . Codeine Shortness Of Breath  . Penicillins Swelling and Rash    Did it involve swelling of the face/tongue/throat, SOB, or  low BP? No Did it involve sudden or severe rash/hives, skin peeling, or any reaction on the inside of your mouth or nose? No Did you need to seek medical attention at a hospital or doctor's office? No When did it last happen?childhood If all above answers are "NO", may proceed with cephalosporin use.  . Latex Other (See Comments)    blisters    HOME MEDICATIONS: Outpatient Medications Prior to Visit  Medication Sig Dispense Refill  . amLODipine (NORVASC) 2.5 MG tablet Take 2.5 mg by mouth 2 (two) times a day.    . carvedilol (COREG) 3.125 MG tablet Take 3.125 mg by mouth 2 (two) times a day.    . Cranberry-Vitamin C (AZO CRANBERRY URINARY TRACT PO) Take 2 tablets by mouth daily.    [name] docusate sodium (COLACE) 100 MG capsule Take 100 mg by mouth as needed for mild constipation.    [name] donepezil (ARICEPT) 10 MG tablet Take 10 mg by mouth every evening.    . Ginger, Zingiber officinalis, (GINGER ROOT) 550 MG CAPS Take 1 each by mouth daily at 12 noon.    . Ginkgo Biloba (GNP GINGKO BILOBA EXTRACT PO) Take 120 mg by mouth daily at 12 noon.    . Melatonin 3 MG TABS Take 4.5 mg by mouth at bedtime.    . memantine (NAMENDA) 5 MG tablet Take 10 mg by mouth 2 (two) times a day.    . Multiple Vitamin (MULTIVITAMIN PO) 1 tablet at noon. Brand: Life extension-High Potency Multivitamin and Mineral Supplement    . naproxen sodium (ALEVE) 220 MG tablet Take 440 mg by mouth daily as needed (pain).    . Omega-3 Fatty Acids (FISH OIL) 500 MG CAPS Take 1 each by mouth daily at 12 noon.    [name] OVER THE COUNTER MEDICATION Calcium    . Probiotic Product (PROBIOTIC PO) Take 1 tablet by mouth daily at 12 noon. 15 strains/25 billion microorganisms    . senna (SENOKOT) 8.6 MG TABS tablet Take 1 tablet by mouth as needed for mild constipation.    . sodium chloride 1 g tablet Take 1 g by mouth daily.    . traMADol (ULTRAM) 50 MG tablet Take 1 tablet (50 mg total) by mouth every 6 (six) hours as needed. 15 tablet 0     No facility-administered medications prior to visit.    PAST MEDICAL HISTORY: Past Medical History:  Diagnosis Date  . Breast cancer ([place])    Reportedly admission  . Dementia ([place])   . Hip fracture requiring operative repair, right, sequela 04/2018   Likely related to fall- s/p ORIF-right femur fracture  . Hypertension   . Macular degeneration   . Osteoarthritis (arthritis due to wear and tear of joints)    Hips and knees as well as hands  . Osteoporosis   . Pelvic fracture ([place]) 11/14/2018   and coccyx; late the fall.  Medical management  PAST SURGICAL HISTORY: Past Surgical History:  Procedure Laterality Date  . BREAST LUMPECTOMY     malignant  . cyst removal     from lower back, rods placed  . LEFT HEART CATH AND CORONARY ANGIOGRAPHY  03/2017   [facility]): EF 55%; large LAD (diffuse moderate disease) with 3 diagonal branches.  D2 and D3 are small; #1 large caliber LCx with 3 OM branches (OM1 and OM 2 are small, OM 3 medium caliber main branch with minimal disease.  Large dominant RCA with moderate proximal disease, mid 30 to 40%.  Medium sized PDA with minimal disease; FFR 50% mLAD - 0.96* NOT SIGNIFICANT  . NM MYOVIEW LTD  06/2014   [facility]: No evidence of ischemia or infarction.  [name] TOTAL HIP ARTHROPLASTY     broken femur, R side, repaired  . TRANSTHORACIC ECHOCARDIOGRAM  05/10/2018    LVEF 60 to 65%.  GR 1 DD.  Normal RV.  Mild RA and mild to moderate LA dilation.  Sclerotic aortic valve with no stenosis.    FAMILY HISTORY: Family History  Problem Relation Age of Onset  . Stroke Mother   . Cancer Father   . Cancer Sister   . Cancer Brother   . Cancer Other        2 sisters and several brothers    SOCIAL HISTORY: Social History   Socioeconomic History  . Marital status: Married    Spouse name: Not on file  . Number of children: 3  . Years of education: Not on file  . Highest education level: Master's degree (e.g., MA, MS, MEng,  MEd, MSW, MBA)  Occupational History  . Not on file  Tobacco Use  . Smoking status: Never Smoker  . Smokeless tobacco: Never Used  Vaping Use  . Vaping Use: Never used  Substance and Sexual Activity  . Alcohol use: Never  . Drug use: Never  . Sexual activity: Not on file  Other Topics Concern  . Not on file  Social History Narrative   Recently moved from [place], [place] to [place] area to be close to her daughter and son-in-law.   She currently lives with her husband (a retired Theme park manager).  Husband notes caregiver burnout issues.      Social Determinants of Health   Financial Resource Strain:   . Difficulty of Paying Living Expenses: Not on file  Food Insecurity:   . Worried About Charity fundraiser in the Last Year: Not on file  . Ran Out of Food in the Last Year: Not on file  Transportation Needs:   . Lack of Transportation (Medical): Not on file  . Lack of Transportation (Non-Medical): Not on file  Physical Activity:   . Days of Exercise per Week: Not on file  . Minutes of Exercise per Session: Not on file  Stress:   . Feeling of Stress : Not on file  Social Connections:   . Frequency of Communication with Friends and Family: Not on file  . Frequency of Social Gatherings with Friends and Family: Not on file  . Attends Religious Services: Not on file  . Active Member of Clubs or Organizations: Not on file  . Attends Archivist Meetings: Not on file  . Marital Status: Not on file  Intimate Partner Violence:   . Fear of Current or Ex-Partner: Not on file  . Emotionally Abused: Not on file  . Physically Abused: Not on file  . Sexually Abused: Not  on file      PHYSICAL EXAM  Vitals:   01/04/20 1255  BP: (!) 156/77  Cruz: 67  Height: 5' 5"  (1.651 m)   Body mass index is 24.96 kg/m.  Generalized: Well developed, in no acute distress  Cardiology: normal rate and rhythm, no murmur noted Respiratory: clear to auscultation bilaterally  Neurological  examination  Mentation: Alert, appears happy, not oriented to time, place, or history taking. Follows intermittent commands speech and language fluent Cranial nerve II-XII: Pupils were equal round reactive to light. Extraocular movements were full, unable to complete exam as patient not following commands.  Motor: The motor testing reveals 5 over 5 strength of bilateral upper extremities, 4/5 left lower, 3+/4 right lower hip flexion  Sensory: Sensory testing is intact to soft touch on all 4 extremities. No evidence of extinction is noted.  Coordination: patient unable to perform Gait and station: Gait was not assessed today, patient in a wheelchair.   DIAGNOSTIC DATA (LABS, IMAGING, TESTING) - I reviewed patient records, labs, notes, testing and imaging myself where available.  No flowsheet data found.   Lab Results  Component Value Date   WBC 12.8 (H) 11/14/2018   HGB 13.0 11/14/2018   HCT 41.4 11/14/2018   MCV 97.2 11/14/2018   PLT 174 11/14/2018      Component Value Date/Time   NA 140 11/14/2018 0555   K 3.7 11/14/2018 0555   CL 105 11/14/2018 0555   CO2 26 11/14/2018 0555   GLUCOSE 99 11/14/2018 0555   BUN 19 11/14/2018 0555   CREATININE 0.73 11/14/2018 0555   CALCIUM 9.0 11/14/2018 0555   GFRNONAA >60 11/14/2018 0555   GFRAA >60 11/14/2018 0555   No results found for: CHOL, HDL, LDLCALC, LDLDIRECT, TRIG, CHOLHDL No results found for: HGBA1C No results found for: VITAMINB12 No results found for: TSH     ASSESSMENT AND PLAN 86 y.o. year old female  has a past medical history of Breast cancer ([place]), Dementia ([place]), Hip fracture requiring operative repair, right, sequela (04/2018), Hypertension, Macular degeneration, Osteoarthritis (arthritis due to wear and tear of joints), Osteoporosis, and Pelvic fracture ([place]) (11/14/2018). here with     ICD-10-CM   1. Dementia without behavioral disturbance, unspecified dementia type [facility])  F03.90     Sandra Cruz is doing fairly well,  today. We will continue Namenda and Aricept. She has been more active with PT and was encouraged to continue as advised. We have discussed nature of dementia and progression of this disease. Regular physical and mental exercise advised. Safety and fall precautions discussed. She was encouraged to continue eating and stay well hydrated. She will follow up closely with PCP. May return to see [place] as needed. She and her family verbalize understanding and agreement with this plan.    No orders of the defined types were placed in this encounter.    No orders of the defined types were placed in this encounter.     I spent 30 minutes with the patient. 50% of this time was spent counseling and educating patient on plan of care and medications.    Debbora Presto, FNP-C 01/04/2020, 1:04 PM [facility] [address], [place], [place] [address] [phone]  Made any corrections needed, and agree with history, physical, neuro exam,assessment and plan as stated.     Sarina Ill, MD [facility]

## 2020-01-03 NOTE — Patient Instructions (Addendum)
We will continue Aricept and Namenda   You may follow up with PCP, follow up with [place] as needed    Memory Compensation Strategies  1. Use "WARM" strategy.  W= write it down  A= associate it  R= repeat it  M= make a mental note  2.   You can keep a Social worker.  Use a 3-ring notebook with sections for the following: calendar, important names and phone numbers,  medications, doctors' names/phone numbers, lists/reminders, and a section to journal what you did  each day.   3.    Use a calendar to write appointments down.  4.    Write yourself a schedule for the day.  This can be placed on the calendar or in a separate section of the Memory Notebook.  Keeping a  regular schedule can help memory.  5.    Use medication organizer with sections for each day or morning/evening pills.  You may need help loading it  6.    Keep a basket, or pegboard by the door.  Place items that you need to take out with you in the basket or on the pegboard.  You may also want to  include a message board for reminders.  7.    Use sticky notes.  Place sticky notes with reminders in a place where the task is performed.  For example: " turn off the  stove" placed by the stove, "lock the door" placed on the door at eye level, " take your medications" on  the bathroom mirror or by the place where you normally take your medications.  8.    Use alarms/timers.  Use while cooking to remind yourself to check on food or as a reminder to take your medicine, or as a  reminder to make a call, or as a reminder to perform another task, etc.   Dementia Dementia is a condition that affects the way the brain works. It often affects memory and thinking. There are many types of dementia. Some types get worse with time and cannot be reversed. Some types of dementia include:  Alzheimer's disease. This is the most common type.  Vascular dementia. This type may happen due to a stroke.  Lewy body dementia. This type may  happen to people who have Parkinson's disease.  Frontotemporal dementia. This type is caused by damage to nerve cells in certain parts of the brain. Some people may have more than one type, and this is called mixed dementia. What are the causes? This condition is caused by damage to cells in the brain. Some causes that cannot be reversed include:  Having a condition that affects the blood vessels of the brain, such as diabetes, heart disease, or blood vessel disease.  Changes to genes. Some causes that can be reversed or slowed include:  Injury to the brain.  Certain medicines.  Infection.  Not having enough vitamin B12 in the body, or thyroid problems.  A tumor or blood clot in the brain. What are the signs or symptoms? Symptoms depend on the type of dementia. This may include:  Problems remembering things.  Having trouble taking a bath or putting clothes on.  Forgetting appointments.  Forgetting to pay bills.  Trouble planning and making meals.  Having trouble speaking.  Getting lost easily. How is this treated? Treatment depends on the cause of the dementia. It might include taking medicines that help:  To control the dementia.  To slow down the dementia.  To manage symptoms.  In some cases, treating the cause of your dementia can improve symptoms, reverse symptoms, or slow down how quickly it gets worse. Your doctor can help you find support groups and other doctors who can help with your care. Follow these instructions at home: Medicines  Take over-the-counter and prescription medicines only as told by your doctor.  Use a pill organizer to help you manage your medicines.  Avoidtaking medicines for pain or for sleep. Lifestyle  Make healthy choices: ? Be active as told by your doctor. ? Do not use any products that contain nicotine or tobacco, such as cigarettes, e-cigarettes, and chewing tobacco. If you need help quitting, ask your doctor. ? Do not drink  alcohol. ? When you get stressed, do something that will help you to relax. Your doctor can give you tips. ? Spend time with other people.  Make sure you get good sleep. To get good sleep: ? Try not to take naps during the day. ? Keep your bedroom dark and cool. ? In the few hours before you go to bed, try not to do any exercise. ? Do not have foods and drinks with caffeine at night. Eating and drinking  Drink enough fluid to keep your pee (urine) pale yellow.  Eat a healthy diet. General instructions   Talk with your doctor to figure out: ? What you need help with. ? What your safety needs are.  Ask your doctor if it is safe for you to drive.  If told, wear a bracelet that tracks where you are or shows that you are a person with memory loss.  Work with your family to make big decisions.  Keep all follow-up visits as told by your doctor. This is important. Contact a doctor if:  You have any new symptoms.  Your symptoms get worse.  You have problems with swallowing or choking. Get help right away if:  You feel very sad, or feel that you want to harm yourself.  You or your family members are worried for your safety. If you ever feel like you may hurt yourself or others, or have thoughts about taking your own life, get help right away. You can go to your nearest emergency department or call:  Your local emergency services (911 in the [place]  A suicide crisis helpline, such as the [facility] at 270-326-1367. This is open 24 hours a day. Summary  Dementia often affects memory and thinking.  Some types of dementia get worse with time and cannot be reversed.  Treatment for this condition depends on the cause.  Talk with your doctor to figure out what you need help with.  Your doctor can help you find support groups and other doctors who can help with your care. This information is not intended to replace advice given to you by your health  care provider. Make sure you discuss any questions you have with your health care provider. Document Revised: 07/19/2018 Document Reviewed: 07/19/2018 Elsevier Patient Education  [employer].

## 2020-01-04 ENCOUNTER — Encounter: Payer: Self-pay | Admitting: Family Medicine

## 2020-01-04 ENCOUNTER — Ambulatory Visit (INDEPENDENT_AMBULATORY_CARE_PROVIDER_SITE_OTHER): Payer: Medicare Other | Admitting: Family Medicine

## 2020-01-04 VITALS — BP 156/77 | HR 67 | Ht 65.0 in

## 2020-01-04 DIAGNOSIS — F0390 Unspecified dementia without behavioral disturbance: Secondary | ICD-10-CM

## 2020-01-18 ENCOUNTER — Other Ambulatory Visit (HOSPITAL_COMMUNITY): Payer: Self-pay | Admitting: Sports Medicine

## 2020-01-18 ENCOUNTER — Ambulatory Visit (HOSPITAL_COMMUNITY)
Admission: RE | Admit: 2020-01-18 | Discharge: 2020-01-18 | Disposition: A | Discharge status: Home / Self Care | Payer: Medicare Other | Source: Ambulatory Visit | Attending: Sports Medicine | Admitting: Sports Medicine

## 2020-01-18 ENCOUNTER — Other Ambulatory Visit: Payer: Self-pay

## 2020-01-18 DIAGNOSIS — R600 Localized edema: Secondary | ICD-10-CM | POA: Diagnosis not present

## 2020-01-19 ENCOUNTER — Encounter (HOSPITAL_COMMUNITY): Payer: Medicare Other

## 2020-01-23 ENCOUNTER — Encounter (INDEPENDENT_AMBULATORY_CARE_PROVIDER_SITE_OTHER): Payer: Medicare Other | Admitting: Ophthalmology

## 2020-01-23 ENCOUNTER — Other Ambulatory Visit: Payer: Self-pay

## 2020-01-23 DIAGNOSIS — H43813 Vitreous degeneration, bilateral: Secondary | ICD-10-CM

## 2020-01-23 DIAGNOSIS — H353231 Exudative age-related macular degeneration, bilateral, with active choroidal neovascularization: Secondary | ICD-10-CM

## 2020-02-06 ENCOUNTER — Telehealth: Payer: Self-pay | Admitting: Family Medicine

## 2020-02-06 MED ORDER — DONEPEZIL HCL 10 MG PO TABS
10.0000 mg | ORAL_TABLET | Freq: Every evening | ORAL | 3 refills | Status: DC
Start: 2020-02-06 — End: 2022-04-29

## 2020-02-06 MED ORDER — MEMANTINE HCL 10 MG PO TABS: 10.0000 mg | ORAL_TABLET | Freq: Two times a day (BID) | ORAL | 3 refills | Status: DC

## 2020-02-06 NOTE — Telephone Encounter (Signed)
I called [facility] and spoke to pharmacist.  Pt is taking memantine 10mg  po bid, and donepezil 10mg  po qhs.  I relayed that neuro (we will take over filling for pt and to cancel all other orders for these medications).

## 2020-02-06 NOTE — Addendum Note (Signed)
Addended by: Brandon Melnick on: 02/06/2020 02:48 PM   Modules accepted: Orders

## 2020-02-06 NOTE — Telephone Encounter (Addendum)
LMVM for pts daughter Juliann Pulse to call.  Refill memantine dose?  10mg  po bid and donepezil 10mg  po pm  to confirm [phone].  Also to have [place] refill need annual RV to med refills.

## 2020-02-06 NOTE — Telephone Encounter (Signed)
russell, Sandra Cruz(daughter on DPR) left a voicemail stating pt's PCP previously filled pt's  memantine (NAMENDA) 5 MG tablet.  Daughter stated this now needs to be filled by pt's Neurologist.  Daughter is asking this be filled at [phone] .  Daughter is asking to be called if there is a problem with this request.

## 2020-06-07 DIAGNOSIS — R059 Cough, unspecified: Secondary | ICD-10-CM | POA: Diagnosis not present

## 2020-06-07 DIAGNOSIS — J988 Other specified respiratory disorders: Secondary | ICD-10-CM | POA: Diagnosis not present

## 2020-06-24 DIAGNOSIS — M6281 Muscle weakness (generalized): Secondary | ICD-10-CM | POA: Diagnosis not present

## 2020-06-24 DIAGNOSIS — R2689 Other abnormalities of gait and mobility: Secondary | ICD-10-CM | POA: Diagnosis not present

## 2020-06-27 DIAGNOSIS — M6281 Muscle weakness (generalized): Secondary | ICD-10-CM | POA: Diagnosis not present

## 2020-06-27 DIAGNOSIS — R2689 Other abnormalities of gait and mobility: Secondary | ICD-10-CM | POA: Diagnosis not present

## 2020-07-01 DIAGNOSIS — M6281 Muscle weakness (generalized): Secondary | ICD-10-CM | POA: Diagnosis not present

## 2020-07-01 DIAGNOSIS — R2689 Other abnormalities of gait and mobility: Secondary | ICD-10-CM | POA: Diagnosis not present

## 2020-07-02 DIAGNOSIS — H35371 Puckering of macula, right eye: Secondary | ICD-10-CM | POA: Diagnosis not present

## 2020-07-02 DIAGNOSIS — H353232 Exudative age-related macular degeneration, bilateral, with inactive choroidal neovascularization: Secondary | ICD-10-CM | POA: Diagnosis not present

## 2020-07-02 DIAGNOSIS — H35423 Microcystoid degeneration of retina, bilateral: Secondary | ICD-10-CM | POA: Diagnosis not present

## 2020-07-02 DIAGNOSIS — H35433 Paving stone degeneration of retina, bilateral: Secondary | ICD-10-CM | POA: Diagnosis not present

## 2020-07-04 DIAGNOSIS — R2689 Other abnormalities of gait and mobility: Secondary | ICD-10-CM | POA: Diagnosis not present

## 2020-07-04 DIAGNOSIS — M6281 Muscle weakness (generalized): Secondary | ICD-10-CM | POA: Diagnosis not present

## 2020-07-09 DIAGNOSIS — M6281 Muscle weakness (generalized): Secondary | ICD-10-CM | POA: Diagnosis not present

## 2020-07-09 DIAGNOSIS — R2689 Other abnormalities of gait and mobility: Secondary | ICD-10-CM | POA: Diagnosis not present

## 2020-07-11 DIAGNOSIS — M6281 Muscle weakness (generalized): Secondary | ICD-10-CM | POA: Diagnosis not present

## 2020-07-11 DIAGNOSIS — R2689 Other abnormalities of gait and mobility: Secondary | ICD-10-CM | POA: Diagnosis not present

## 2020-07-16 DIAGNOSIS — R2689 Other abnormalities of gait and mobility: Secondary | ICD-10-CM | POA: Diagnosis not present

## 2020-07-16 DIAGNOSIS — M6281 Muscle weakness (generalized): Secondary | ICD-10-CM | POA: Diagnosis not present

## 2020-07-19 DIAGNOSIS — R2689 Other abnormalities of gait and mobility: Secondary | ICD-10-CM | POA: Diagnosis not present

## 2020-07-19 DIAGNOSIS — M6281 Muscle weakness (generalized): Secondary | ICD-10-CM | POA: Diagnosis not present

## 2020-07-22 ENCOUNTER — Encounter (INDEPENDENT_AMBULATORY_CARE_PROVIDER_SITE_OTHER): Payer: Medicare Other | Admitting: Ophthalmology

## 2020-07-22 DIAGNOSIS — M6281 Muscle weakness (generalized): Secondary | ICD-10-CM | POA: Diagnosis not present

## 2020-07-22 DIAGNOSIS — R2689 Other abnormalities of gait and mobility: Secondary | ICD-10-CM | POA: Diagnosis not present

## 2020-07-23 DIAGNOSIS — R2689 Other abnormalities of gait and mobility: Secondary | ICD-10-CM | POA: Diagnosis not present

## 2020-07-23 DIAGNOSIS — M6281 Muscle weakness (generalized): Secondary | ICD-10-CM | POA: Diagnosis not present

## 2020-07-26 DIAGNOSIS — M6281 Muscle weakness (generalized): Secondary | ICD-10-CM | POA: Diagnosis not present

## 2020-07-26 DIAGNOSIS — R2689 Other abnormalities of gait and mobility: Secondary | ICD-10-CM | POA: Diagnosis not present

## 2020-07-29 DIAGNOSIS — R2689 Other abnormalities of gait and mobility: Secondary | ICD-10-CM | POA: Diagnosis not present

## 2020-07-29 DIAGNOSIS — M6281 Muscle weakness (generalized): Secondary | ICD-10-CM | POA: Diagnosis not present

## 2020-08-02 DIAGNOSIS — R2689 Other abnormalities of gait and mobility: Secondary | ICD-10-CM | POA: Diagnosis not present

## 2020-08-02 DIAGNOSIS — M6281 Muscle weakness (generalized): Secondary | ICD-10-CM | POA: Diagnosis not present

## 2020-08-06 DIAGNOSIS — M6281 Muscle weakness (generalized): Secondary | ICD-10-CM | POA: Diagnosis not present

## 2020-08-06 DIAGNOSIS — R2689 Other abnormalities of gait and mobility: Secondary | ICD-10-CM | POA: Diagnosis not present

## 2020-08-09 DIAGNOSIS — R2689 Other abnormalities of gait and mobility: Secondary | ICD-10-CM | POA: Diagnosis not present

## 2020-08-09 DIAGNOSIS — M6281 Muscle weakness (generalized): Secondary | ICD-10-CM | POA: Diagnosis not present

## 2020-08-13 DIAGNOSIS — R2689 Other abnormalities of gait and mobility: Secondary | ICD-10-CM | POA: Diagnosis not present

## 2020-08-13 DIAGNOSIS — M6281 Muscle weakness (generalized): Secondary | ICD-10-CM | POA: Diagnosis not present

## 2020-08-16 DIAGNOSIS — M6281 Muscle weakness (generalized): Secondary | ICD-10-CM | POA: Diagnosis not present

## 2020-08-16 DIAGNOSIS — R41841 Cognitive communication deficit: Secondary | ICD-10-CM | POA: Diagnosis not present

## 2020-08-16 DIAGNOSIS — R488 Other symbolic dysfunctions: Secondary | ICD-10-CM | POA: Diagnosis not present

## 2020-08-16 DIAGNOSIS — R2689 Other abnormalities of gait and mobility: Secondary | ICD-10-CM | POA: Diagnosis not present

## 2020-08-20 DIAGNOSIS — M6281 Muscle weakness (generalized): Secondary | ICD-10-CM | POA: Diagnosis not present

## 2020-08-20 DIAGNOSIS — R41841 Cognitive communication deficit: Secondary | ICD-10-CM | POA: Diagnosis not present

## 2020-08-20 DIAGNOSIS — R488 Other symbolic dysfunctions: Secondary | ICD-10-CM | POA: Diagnosis not present

## 2020-08-20 DIAGNOSIS — R2689 Other abnormalities of gait and mobility: Secondary | ICD-10-CM | POA: Diagnosis not present

## 2020-08-22 DIAGNOSIS — R488 Other symbolic dysfunctions: Secondary | ICD-10-CM | POA: Diagnosis not present

## 2020-08-22 DIAGNOSIS — R2689 Other abnormalities of gait and mobility: Secondary | ICD-10-CM | POA: Diagnosis not present

## 2020-08-22 DIAGNOSIS — M6281 Muscle weakness (generalized): Secondary | ICD-10-CM | POA: Diagnosis not present

## 2020-08-22 DIAGNOSIS — R41841 Cognitive communication deficit: Secondary | ICD-10-CM | POA: Diagnosis not present

## 2020-08-23 DIAGNOSIS — R41841 Cognitive communication deficit: Secondary | ICD-10-CM | POA: Diagnosis not present

## 2020-08-23 DIAGNOSIS — R2689 Other abnormalities of gait and mobility: Secondary | ICD-10-CM | POA: Diagnosis not present

## 2020-08-23 DIAGNOSIS — M6281 Muscle weakness (generalized): Secondary | ICD-10-CM | POA: Diagnosis not present

## 2020-08-23 DIAGNOSIS — R488 Other symbolic dysfunctions: Secondary | ICD-10-CM | POA: Diagnosis not present

## 2020-08-26 DIAGNOSIS — R488 Other symbolic dysfunctions: Secondary | ICD-10-CM | POA: Diagnosis not present

## 2020-08-26 DIAGNOSIS — R2689 Other abnormalities of gait and mobility: Secondary | ICD-10-CM | POA: Diagnosis not present

## 2020-08-26 DIAGNOSIS — R41841 Cognitive communication deficit: Secondary | ICD-10-CM | POA: Diagnosis not present

## 2020-08-26 DIAGNOSIS — M6281 Muscle weakness (generalized): Secondary | ICD-10-CM | POA: Diagnosis not present

## 2020-08-28 DIAGNOSIS — R2689 Other abnormalities of gait and mobility: Secondary | ICD-10-CM | POA: Diagnosis not present

## 2020-08-28 DIAGNOSIS — R41841 Cognitive communication deficit: Secondary | ICD-10-CM | POA: Diagnosis not present

## 2020-08-28 DIAGNOSIS — M6281 Muscle weakness (generalized): Secondary | ICD-10-CM | POA: Diagnosis not present

## 2020-08-28 DIAGNOSIS — R488 Other symbolic dysfunctions: Secondary | ICD-10-CM | POA: Diagnosis not present

## 2020-08-30 DIAGNOSIS — R488 Other symbolic dysfunctions: Secondary | ICD-10-CM | POA: Diagnosis not present

## 2020-08-30 DIAGNOSIS — M6281 Muscle weakness (generalized): Secondary | ICD-10-CM | POA: Diagnosis not present

## 2020-08-30 DIAGNOSIS — R41841 Cognitive communication deficit: Secondary | ICD-10-CM | POA: Diagnosis not present

## 2020-08-30 DIAGNOSIS — R2689 Other abnormalities of gait and mobility: Secondary | ICD-10-CM | POA: Diagnosis not present

## 2020-09-04 DIAGNOSIS — M6281 Muscle weakness (generalized): Secondary | ICD-10-CM | POA: Diagnosis not present

## 2020-09-04 DIAGNOSIS — R488 Other symbolic dysfunctions: Secondary | ICD-10-CM | POA: Diagnosis not present

## 2020-09-04 DIAGNOSIS — R41841 Cognitive communication deficit: Secondary | ICD-10-CM | POA: Diagnosis not present

## 2020-09-04 DIAGNOSIS — R2689 Other abnormalities of gait and mobility: Secondary | ICD-10-CM | POA: Diagnosis not present

## 2020-09-06 DIAGNOSIS — R41841 Cognitive communication deficit: Secondary | ICD-10-CM | POA: Diagnosis not present

## 2020-09-06 DIAGNOSIS — M6281 Muscle weakness (generalized): Secondary | ICD-10-CM | POA: Diagnosis not present

## 2020-09-06 DIAGNOSIS — R488 Other symbolic dysfunctions: Secondary | ICD-10-CM | POA: Diagnosis not present

## 2020-09-06 DIAGNOSIS — R2689 Other abnormalities of gait and mobility: Secondary | ICD-10-CM | POA: Diagnosis not present

## 2020-09-11 DIAGNOSIS — R41841 Cognitive communication deficit: Secondary | ICD-10-CM | POA: Diagnosis not present

## 2020-09-11 DIAGNOSIS — M6281 Muscle weakness (generalized): Secondary | ICD-10-CM | POA: Diagnosis not present

## 2020-09-11 DIAGNOSIS — R488 Other symbolic dysfunctions: Secondary | ICD-10-CM | POA: Diagnosis not present

## 2020-09-11 DIAGNOSIS — R2689 Other abnormalities of gait and mobility: Secondary | ICD-10-CM | POA: Diagnosis not present

## 2020-09-13 DIAGNOSIS — R41841 Cognitive communication deficit: Secondary | ICD-10-CM | POA: Diagnosis not present

## 2020-09-13 DIAGNOSIS — R488 Other symbolic dysfunctions: Secondary | ICD-10-CM | POA: Diagnosis not present

## 2020-09-13 DIAGNOSIS — M6281 Muscle weakness (generalized): Secondary | ICD-10-CM | POA: Diagnosis not present

## 2020-09-13 DIAGNOSIS — R2689 Other abnormalities of gait and mobility: Secondary | ICD-10-CM | POA: Diagnosis not present

## 2020-09-16 DIAGNOSIS — R41841 Cognitive communication deficit: Secondary | ICD-10-CM | POA: Diagnosis not present

## 2020-09-16 DIAGNOSIS — R2689 Other abnormalities of gait and mobility: Secondary | ICD-10-CM | POA: Diagnosis not present

## 2020-09-16 DIAGNOSIS — M6281 Muscle weakness (generalized): Secondary | ICD-10-CM | POA: Diagnosis not present

## 2020-09-16 DIAGNOSIS — R488 Other symbolic dysfunctions: Secondary | ICD-10-CM | POA: Diagnosis not present

## 2020-09-23 DIAGNOSIS — R2689 Other abnormalities of gait and mobility: Secondary | ICD-10-CM | POA: Diagnosis not present

## 2020-09-23 DIAGNOSIS — R41841 Cognitive communication deficit: Secondary | ICD-10-CM | POA: Diagnosis not present

## 2020-09-23 DIAGNOSIS — M6281 Muscle weakness (generalized): Secondary | ICD-10-CM | POA: Diagnosis not present

## 2020-09-23 DIAGNOSIS — R488 Other symbolic dysfunctions: Secondary | ICD-10-CM | POA: Diagnosis not present

## 2020-09-26 DIAGNOSIS — R41841 Cognitive communication deficit: Secondary | ICD-10-CM | POA: Diagnosis not present

## 2020-09-26 DIAGNOSIS — R2689 Other abnormalities of gait and mobility: Secondary | ICD-10-CM | POA: Diagnosis not present

## 2020-09-26 DIAGNOSIS — R488 Other symbolic dysfunctions: Secondary | ICD-10-CM | POA: Diagnosis not present

## 2020-09-26 DIAGNOSIS — M6281 Muscle weakness (generalized): Secondary | ICD-10-CM | POA: Diagnosis not present

## 2020-09-30 DIAGNOSIS — R488 Other symbolic dysfunctions: Secondary | ICD-10-CM | POA: Diagnosis not present

## 2020-09-30 DIAGNOSIS — R2689 Other abnormalities of gait and mobility: Secondary | ICD-10-CM | POA: Diagnosis not present

## 2020-09-30 DIAGNOSIS — R41841 Cognitive communication deficit: Secondary | ICD-10-CM | POA: Diagnosis not present

## 2020-09-30 DIAGNOSIS — M6281 Muscle weakness (generalized): Secondary | ICD-10-CM | POA: Diagnosis not present

## 2020-10-02 DIAGNOSIS — R488 Other symbolic dysfunctions: Secondary | ICD-10-CM | POA: Diagnosis not present

## 2020-10-02 DIAGNOSIS — R2689 Other abnormalities of gait and mobility: Secondary | ICD-10-CM | POA: Diagnosis not present

## 2020-10-02 DIAGNOSIS — R41841 Cognitive communication deficit: Secondary | ICD-10-CM | POA: Diagnosis not present

## 2020-10-02 DIAGNOSIS — M6281 Muscle weakness (generalized): Secondary | ICD-10-CM | POA: Diagnosis not present

## 2020-10-07 DIAGNOSIS — R41841 Cognitive communication deficit: Secondary | ICD-10-CM | POA: Diagnosis not present

## 2020-10-07 DIAGNOSIS — R488 Other symbolic dysfunctions: Secondary | ICD-10-CM | POA: Diagnosis not present

## 2020-10-07 DIAGNOSIS — M6281 Muscle weakness (generalized): Secondary | ICD-10-CM | POA: Diagnosis not present

## 2020-10-07 DIAGNOSIS — R2689 Other abnormalities of gait and mobility: Secondary | ICD-10-CM | POA: Diagnosis not present

## 2020-10-10 DIAGNOSIS — R2689 Other abnormalities of gait and mobility: Secondary | ICD-10-CM | POA: Diagnosis not present

## 2020-10-10 DIAGNOSIS — R488 Other symbolic dysfunctions: Secondary | ICD-10-CM | POA: Diagnosis not present

## 2020-10-10 DIAGNOSIS — M6281 Muscle weakness (generalized): Secondary | ICD-10-CM | POA: Diagnosis not present

## 2020-10-10 DIAGNOSIS — R41841 Cognitive communication deficit: Secondary | ICD-10-CM | POA: Diagnosis not present

## 2020-10-15 DIAGNOSIS — R488 Other symbolic dysfunctions: Secondary | ICD-10-CM | POA: Diagnosis not present

## 2020-10-15 DIAGNOSIS — R41841 Cognitive communication deficit: Secondary | ICD-10-CM | POA: Diagnosis not present

## 2020-10-15 DIAGNOSIS — M6281 Muscle weakness (generalized): Secondary | ICD-10-CM | POA: Diagnosis not present

## 2020-10-15 DIAGNOSIS — R2689 Other abnormalities of gait and mobility: Secondary | ICD-10-CM | POA: Diagnosis not present

## 2020-10-21 DIAGNOSIS — R2689 Other abnormalities of gait and mobility: Secondary | ICD-10-CM | POA: Diagnosis not present

## 2020-10-21 DIAGNOSIS — R41841 Cognitive communication deficit: Secondary | ICD-10-CM | POA: Diagnosis not present

## 2020-10-21 DIAGNOSIS — M6281 Muscle weakness (generalized): Secondary | ICD-10-CM | POA: Diagnosis not present

## 2020-10-21 DIAGNOSIS — R488 Other symbolic dysfunctions: Secondary | ICD-10-CM | POA: Diagnosis not present

## 2020-10-24 DIAGNOSIS — R2689 Other abnormalities of gait and mobility: Secondary | ICD-10-CM | POA: Diagnosis not present

## 2020-10-24 DIAGNOSIS — R488 Other symbolic dysfunctions: Secondary | ICD-10-CM | POA: Diagnosis not present

## 2020-10-24 DIAGNOSIS — R41841 Cognitive communication deficit: Secondary | ICD-10-CM | POA: Diagnosis not present

## 2020-10-24 DIAGNOSIS — M6281 Muscle weakness (generalized): Secondary | ICD-10-CM | POA: Diagnosis not present

## 2020-10-31 DIAGNOSIS — M179 Osteoarthritis of knee, unspecified: Secondary | ICD-10-CM | POA: Diagnosis not present

## 2020-10-31 DIAGNOSIS — M6281 Muscle weakness (generalized): Secondary | ICD-10-CM | POA: Diagnosis not present

## 2020-10-31 DIAGNOSIS — F0390 Unspecified dementia without behavioral disturbance: Secondary | ICD-10-CM | POA: Diagnosis not present

## 2020-10-31 DIAGNOSIS — R2689 Other abnormalities of gait and mobility: Secondary | ICD-10-CM | POA: Diagnosis not present

## 2020-10-31 DIAGNOSIS — R488 Other symbolic dysfunctions: Secondary | ICD-10-CM | POA: Diagnosis not present

## 2020-10-31 DIAGNOSIS — R41841 Cognitive communication deficit: Secondary | ICD-10-CM | POA: Diagnosis not present

## 2020-10-31 DIAGNOSIS — H6120 Impacted cerumen, unspecified ear: Secondary | ICD-10-CM | POA: Diagnosis not present

## 2020-10-31 DIAGNOSIS — I10 Essential (primary) hypertension: Secondary | ICD-10-CM | POA: Diagnosis not present

## 2020-10-31 DIAGNOSIS — E785 Hyperlipidemia, unspecified: Secondary | ICD-10-CM | POA: Diagnosis not present

## 2020-11-05 DIAGNOSIS — M6281 Muscle weakness (generalized): Secondary | ICD-10-CM | POA: Diagnosis not present

## 2020-11-05 DIAGNOSIS — R41841 Cognitive communication deficit: Secondary | ICD-10-CM | POA: Diagnosis not present

## 2020-11-05 DIAGNOSIS — R488 Other symbolic dysfunctions: Secondary | ICD-10-CM | POA: Diagnosis not present

## 2020-11-05 DIAGNOSIS — R2689 Other abnormalities of gait and mobility: Secondary | ICD-10-CM | POA: Diagnosis not present

## 2020-11-14 DIAGNOSIS — R2681 Unsteadiness on feet: Secondary | ICD-10-CM | POA: Diagnosis not present

## 2020-11-14 DIAGNOSIS — M25461 Effusion, right knee: Secondary | ICD-10-CM | POA: Diagnosis not present

## 2020-11-14 DIAGNOSIS — M25561 Pain in right knee: Secondary | ICD-10-CM | POA: Diagnosis not present

## 2020-11-14 DIAGNOSIS — M25462 Effusion, left knee: Secondary | ICD-10-CM | POA: Diagnosis not present

## 2020-11-14 DIAGNOSIS — M170 Bilateral primary osteoarthritis of knee: Secondary | ICD-10-CM | POA: Diagnosis not present

## 2020-11-14 DIAGNOSIS — M25562 Pain in left knee: Secondary | ICD-10-CM | POA: Diagnosis not present

## 2020-11-29 DIAGNOSIS — R488 Other symbolic dysfunctions: Secondary | ICD-10-CM | POA: Diagnosis not present

## 2020-11-29 DIAGNOSIS — R41841 Cognitive communication deficit: Secondary | ICD-10-CM | POA: Diagnosis not present

## 2020-12-03 DIAGNOSIS — R488 Other symbolic dysfunctions: Secondary | ICD-10-CM | POA: Diagnosis not present

## 2020-12-03 DIAGNOSIS — R41841 Cognitive communication deficit: Secondary | ICD-10-CM | POA: Diagnosis not present

## 2020-12-04 DIAGNOSIS — Z20822 Contact with and (suspected) exposure to covid-19: Secondary | ICD-10-CM | POA: Diagnosis not present

## 2020-12-10 DIAGNOSIS — R488 Other symbolic dysfunctions: Secondary | ICD-10-CM | POA: Diagnosis not present

## 2020-12-10 DIAGNOSIS — R41841 Cognitive communication deficit: Secondary | ICD-10-CM | POA: Diagnosis not present

## 2020-12-12 DIAGNOSIS — H04123 Dry eye syndrome of bilateral lacrimal glands: Secondary | ICD-10-CM | POA: Diagnosis not present

## 2020-12-12 DIAGNOSIS — Z961 Presence of intraocular lens: Secondary | ICD-10-CM | POA: Diagnosis not present

## 2020-12-12 DIAGNOSIS — H353134 Nonexudative age-related macular degeneration, bilateral, advanced atrophic with subfoveal involvement: Secondary | ICD-10-CM | POA: Diagnosis not present

## 2020-12-17 DIAGNOSIS — R41841 Cognitive communication deficit: Secondary | ICD-10-CM | POA: Diagnosis not present

## 2020-12-17 DIAGNOSIS — R488 Other symbolic dysfunctions: Secondary | ICD-10-CM | POA: Diagnosis not present

## 2020-12-31 DIAGNOSIS — R41841 Cognitive communication deficit: Secondary | ICD-10-CM | POA: Diagnosis not present

## 2020-12-31 DIAGNOSIS — R488 Other symbolic dysfunctions: Secondary | ICD-10-CM | POA: Diagnosis not present

## 2021-01-07 DIAGNOSIS — H35433 Paving stone degeneration of retina, bilateral: Secondary | ICD-10-CM | POA: Diagnosis not present

## 2021-01-07 DIAGNOSIS — H353232 Exudative age-related macular degeneration, bilateral, with inactive choroidal neovascularization: Secondary | ICD-10-CM | POA: Diagnosis not present

## 2021-01-07 DIAGNOSIS — H35423 Microcystoid degeneration of retina, bilateral: Secondary | ICD-10-CM | POA: Diagnosis not present

## 2021-01-07 DIAGNOSIS — H43813 Vitreous degeneration, bilateral: Secondary | ICD-10-CM | POA: Diagnosis not present

## 2021-01-10 DIAGNOSIS — R2681 Unsteadiness on feet: Secondary | ICD-10-CM | POA: Diagnosis not present

## 2021-01-10 DIAGNOSIS — M810 Age-related osteoporosis without current pathological fracture: Secondary | ICD-10-CM | POA: Diagnosis not present

## 2021-01-10 DIAGNOSIS — M25461 Effusion, right knee: Secondary | ICD-10-CM | POA: Diagnosis not present

## 2021-01-10 DIAGNOSIS — M170 Bilateral primary osteoarthritis of knee: Secondary | ICD-10-CM | POA: Diagnosis not present

## 2021-01-10 DIAGNOSIS — M25561 Pain in right knee: Secondary | ICD-10-CM | POA: Diagnosis not present

## 2021-01-10 DIAGNOSIS — M25562 Pain in left knee: Secondary | ICD-10-CM | POA: Diagnosis not present

## 2021-01-10 DIAGNOSIS — F0390 Unspecified dementia without behavioral disturbance: Secondary | ICD-10-CM | POA: Diagnosis not present

## 2021-01-14 DIAGNOSIS — R41841 Cognitive communication deficit: Secondary | ICD-10-CM | POA: Diagnosis not present

## 2021-01-14 DIAGNOSIS — R488 Other symbolic dysfunctions: Secondary | ICD-10-CM | POA: Diagnosis not present

## 2021-01-24 ENCOUNTER — Ambulatory Visit: Payer: Medicare Other | Admitting: Cardiology

## 2021-02-19 DIAGNOSIS — M25562 Pain in left knee: Secondary | ICD-10-CM | POA: Diagnosis not present

## 2021-02-19 DIAGNOSIS — M25461 Effusion, right knee: Secondary | ICD-10-CM | POA: Diagnosis not present

## 2021-02-19 DIAGNOSIS — M170 Bilateral primary osteoarthritis of knee: Secondary | ICD-10-CM | POA: Diagnosis not present

## 2021-02-19 DIAGNOSIS — M25561 Pain in right knee: Secondary | ICD-10-CM | POA: Diagnosis not present

## 2021-02-19 DIAGNOSIS — M25462 Effusion, left knee: Secondary | ICD-10-CM | POA: Diagnosis not present

## 2021-03-21 DIAGNOSIS — Z23 Encounter for immunization: Secondary | ICD-10-CM | POA: Diagnosis not present

## 2021-04-22 DIAGNOSIS — Z20822 Contact with and (suspected) exposure to covid-19: Secondary | ICD-10-CM | POA: Diagnosis not present

## 2021-05-07 DIAGNOSIS — R8290 Unspecified abnormal findings in urine: Secondary | ICD-10-CM | POA: Diagnosis not present

## 2021-05-20 DIAGNOSIS — M810 Age-related osteoporosis without current pathological fracture: Secondary | ICD-10-CM | POA: Diagnosis not present

## 2021-05-20 DIAGNOSIS — Z0000 Encounter for general adult medical examination without abnormal findings: Secondary | ICD-10-CM | POA: Diagnosis not present

## 2021-05-20 DIAGNOSIS — E785 Hyperlipidemia, unspecified: Secondary | ICD-10-CM | POA: Diagnosis not present

## 2021-05-22 DIAGNOSIS — I10 Essential (primary) hypertension: Secondary | ICD-10-CM | POA: Diagnosis not present

## 2021-05-22 DIAGNOSIS — H3530 Unspecified macular degeneration: Secondary | ICD-10-CM | POA: Diagnosis not present

## 2021-05-22 DIAGNOSIS — E785 Hyperlipidemia, unspecified: Secondary | ICD-10-CM | POA: Diagnosis not present

## 2021-05-22 DIAGNOSIS — F0390 Unspecified dementia without behavioral disturbance: Secondary | ICD-10-CM | POA: Diagnosis not present

## 2021-05-22 DIAGNOSIS — Z0000 Encounter for general adult medical examination without abnormal findings: Secondary | ICD-10-CM | POA: Diagnosis not present

## 2021-05-22 DIAGNOSIS — M810 Age-related osteoporosis without current pathological fracture: Secondary | ICD-10-CM | POA: Diagnosis not present

## 2021-05-22 DIAGNOSIS — B379 Candidiasis, unspecified: Secondary | ICD-10-CM | POA: Diagnosis not present

## 2021-05-22 DIAGNOSIS — M179 Osteoarthritis of knee, unspecified: Secondary | ICD-10-CM | POA: Diagnosis not present

## 2021-06-09 ENCOUNTER — Other Ambulatory Visit: Payer: Self-pay | Admitting: Family Medicine

## 2021-06-09 DIAGNOSIS — M810 Age-related osteoporosis without current pathological fracture: Secondary | ICD-10-CM

## 2021-06-11 ENCOUNTER — Ambulatory Visit: Payer: Medicare Other | Admitting: Cardiology

## 2021-07-26 DIAGNOSIS — Z20822 Contact with and (suspected) exposure to covid-19: Secondary | ICD-10-CM | POA: Diagnosis not present

## 2021-08-08 DIAGNOSIS — Z20822 Contact with and (suspected) exposure to covid-19: Secondary | ICD-10-CM | POA: Diagnosis not present

## 2021-08-15 DIAGNOSIS — H353221 Exudative age-related macular degeneration, left eye, with active choroidal neovascularization: Secondary | ICD-10-CM | POA: Diagnosis not present

## 2021-08-15 DIAGNOSIS — H353113 Nonexudative age-related macular degeneration, right eye, advanced atrophic without subfoveal involvement: Secondary | ICD-10-CM | POA: Diagnosis not present

## 2021-08-15 DIAGNOSIS — H35432 Paving stone degeneration of retina, left eye: Secondary | ICD-10-CM | POA: Diagnosis not present

## 2021-08-15 DIAGNOSIS — Z20822 Contact with and (suspected) exposure to covid-19: Secondary | ICD-10-CM | POA: Diagnosis not present

## 2021-08-15 DIAGNOSIS — H35423 Microcystoid degeneration of retina, bilateral: Secondary | ICD-10-CM | POA: Diagnosis not present

## 2021-08-26 DIAGNOSIS — Z20822 Contact with and (suspected) exposure to covid-19: Secondary | ICD-10-CM | POA: Diagnosis not present

## 2021-09-01 DIAGNOSIS — Z20822 Contact with and (suspected) exposure to covid-19: Secondary | ICD-10-CM | POA: Diagnosis not present

## 2021-09-03 DIAGNOSIS — R300 Dysuria: Secondary | ICD-10-CM | POA: Diagnosis not present

## 2021-09-08 ENCOUNTER — Ambulatory Visit (INDEPENDENT_AMBULATORY_CARE_PROVIDER_SITE_OTHER): Payer: Medicare Other | Admitting: Cardiology

## 2021-09-08 ENCOUNTER — Encounter: Payer: Self-pay | Admitting: Cardiology

## 2021-09-08 VITALS — BP 132/72 | HR 64 | Ht 66.0 in | Wt 160.0 lb

## 2021-09-08 DIAGNOSIS — E782 Mixed hyperlipidemia: Secondary | ICD-10-CM

## 2021-09-08 DIAGNOSIS — I10 Essential (primary) hypertension: Secondary | ICD-10-CM

## 2021-09-08 DIAGNOSIS — I2510 Atherosclerotic heart disease of native coronary artery without angina pectoris: Secondary | ICD-10-CM | POA: Diagnosis not present

## 2021-09-08 NOTE — Progress Notes (Signed)
Primary Care Provider: London Pepper, MD Cardiologist: Glenetta Hew, MD Electrophysiologist: None Neurologist: Dr. Cydney Ok: Dr. Alvan Dame  Clinic Note: Chief Complaint  Patient presents with   Follow-up    Stable.  No major issues.    ===================================  ASSESSMENT/PLAN   Problem List Items Addressed This Visit       Cardiology Problems   Coronary artery disease, non-occlusive (Chronic)    Nonocclusive disease.  For reasons as mentioned above, we are avoiding statin.  She is on low-dose carvedilol and low-dose amlodipine.  No active symptoms. She is not on aspirin because of fall history.  Will defer to PCP associated to restarting aspirin.  Continue to encourage increased exercise       Relevant Orders   EKG 12-Lead (Completed)   Essential hypertension - Primary (Chronic)    Blood pressure looks good today.  We will not be overly aggressive blood pressure.  She is on low-dose of amlodipine and carvedilol both taken BID.  If she is dizzy woozy lightheaded, etc., would hold a dose.       Relevant Orders   EKG 12-Lead (Completed)   Hyperlipidemia, mixed (Chronic)    No longer on statin.  I think at this point with her memory issues and fatigue etc., the risks of medication outweigh the benefits.  She had relative nonobstructive disease for years ago.  She would not be interested in any further invasive procedures.       Problem List Items Addressed This Visit     Coronary artery disease, non-occlusive (Chronic)      She had relatively normal disease on her CAD couple years ago.  The symptoms she is having seem very consistent with costochondritis based on exam today. She is on carvedilol and amlodipine for CAD. Because of memory issues, no longer on statin. With concern about falling, we discussed aspirin and probably will avoid for now.   Further discussion today would indicate that she probably would not be a candidate for invasive procedures  and therefore we will not proceed with any ischemic evaluation.        Essential hypertension (Chronic)      She is much less anxious today as indicated by her blood pressure and heart rate being better controlled.  She is on stable dose of amlodipine and carvedilol which we will continue.        Costochondritis - Primary (Chronic)      On further evaluation today, she had clear point tenderness in the chest on examination which was pretty exquisite basely back she moved my stethoscope away.  This would go along well with paroxysmal chest pain that is not exertional.   I think naproxen or Aleve that she takes for her other arthritis pains would be reasonable treatment here.  Otherwise I think the tender loving care option of her she is guess is in a cup of coffee or tea is a great treatment option.  Her chest pain improving with taken off her bra strap would also go along with costochondritis.        Hyperlipidemia, mixed (Chronic)      As discussed, even though her lipids are not quite at goal.  I think that at this point we should consider adding a statin or other medication for lipid management.  I am concerned about her memory issues being complicated by being on a statin.   She would not be interested in any invasive procedures.  Her CAD was not that  bad.  Per our discussion, her daughter I decided that having the medication would not be warranted.        ===================================  HPI:    Sandra Cruz is a 88 y.o. female with a previously followed by [facility] with PMH notable for HTN, moderate dementia, history of breast cancer and Nonobstructive CAD by Cath-felt to have Costochondritis below who presents today for 90-monthfollow-up -at the request of MLondon Pepper MD.  Sandra Cruz last seen on 12/28/2019 for a follow-up appointment.  As usual accompanied by her daughter Sandra Cruz  Not very talkative.  Daughter answers questions.  Doing exercise on the  antigravity treadmill.  Note chest pain or pressure.  Some episodes of chest discomfort off and on but not with very much frequency.  She felt better being out of her bra with less pain.  Had a point tenderness location.  Recent Hospitalizations: None  Reviewed  CV studies:    The following studies were reviewed today: (if available, images/films reviewed: From Epic Chart or Care Everywhere) None:  Interval History:   Sandra TRAUTMANNreturns here today along with family.  She is doing fairly well.  She is able to be active without any notable chest pain or pressure.  Energy level is stable.  Eating well.  Good appetite.  CV Review of Symptoms (Summary) Cardiovascular ROS: no chest pain or dyspnea on exertion positive for - -exercise intolerance, some fatigue.  Deconditioned. negative for - edema, irregular heartbeat, orthopnea, palpitations, paroxysmal nocturnal dyspnea, rapid heart rate, shortness of breath, or syncope or near syncope, TIA/amaurosis fugax, claudication  REVIEWED OF SYSTEMS   Review of Systems  Constitutional:  Positive for malaise/fatigue (Deconditioned, also some lack of desire.  Starting to do a little more activity.). Negative for weight loss (Actually her appetite is picked up and she is put on some weight.).  HENT:  Positive for hearing loss. Negative for congestion, nosebleeds and sinus pain.   Respiratory:  Negative for cough, shortness of breath and wheezing.   Cardiovascular:        Per HPI  Gastrointestinal:  Negative for blood in stool and melena.  Genitourinary:  Positive for frequency (Some incontinence). Negative for hematuria.  Musculoskeletal:  Positive for back pain and joint pain. Negative for myalgias.  Neurological:  Positive for dizziness (Sometimes positional, sometimes with moving too fast.) and weakness (Generalized).  Psychiatric/Behavioral:  Positive for memory loss. Negative for depression (May have some dysthymia versus just issues with  dementia.) and suicidal ideas. The patient is nervous/anxious and has insomnia.    I have reviewed and (if needed) personally updated the patient's problem list, medications, allergies, past medical and surgical history, social and family history.   PAST MEDICAL HISTORY   Past Medical History:  Diagnosis Date   Breast cancer ([facility]    Reportedly admission   Dementia ([facility]    Hip fracture requiring operative repair, right, sequela 04/2018   Likely related to fall- s/p ORIF-right femur fracture   Hypertension    Macular degeneration    Osteoarthritis (arthritis due to wear and tear of joints)    Hips and knees as well as hands   Osteoporosis    [place] [place]   and coccyx; late the fall.  Medical management    PAST SURGICAL HISTORY   Past Surgical History:  Procedure Laterality Date   BREAST LUMPECTOMY     malignant   cyst removal     from lower back,  rods placed   LEFT HEART CATH AND CORONARY ANGIOGRAPHY  03/2017   [facility]): EF 55%; large LAD (diffuse moderate disease) with 3 diagonal branches.  D2 and D3 are small; #1 large caliber LCx with 3 OM branches (OM1 and OM 2 are small, OM 3 medium caliber main branch with minimal disease.  Large dominant RCA with moderate proximal disease, mid 30 to 40%.  Medium sized PDA with minimal disease; FFR 50% mLAD - 0.96* NOT SIGNIFICANT   NM MYOVIEW LTD  06/2014   [facility]: No evidence of ischemia or infarction.   TOTAL HIP ARTHROPLASTY     broken femur, R side, repaired   TRANSTHORACIC ECHOCARDIOGRAM  05/10/2018    LVEF 60 to 65%.  GR 1 DD.  Normal RV.  Mild RA and mild to moderate LA dilation.  Sclerotic aortic valve with no stenosis.    There is no immunization history on file for this patient.  MEDICATIONS/ALLERGIES   Current Meds  Medication Sig   amLODipine (NORVASC) 2.5 MG tablet Take 2.5 mg by mouth 2 (two) times a day.   carvedilol (COREG) 3.125 MG tablet Take 3.125 mg by mouth 2  (two) times a day.   clotrimazole-betamethasone (LOTRISONE) cream Apply 1 application. topically 2 (two) times daily.   donepezil (ARICEPT) 10 MG tablet Take 1 tablet (10 mg total) by mouth every evening.   memantine (NAMENDA) 10 MG tablet Take 1 tablet (10 mg total) by mouth 2 (two) times daily.   Multiple Vitamin (MULTIVITAMIN PO) 1 tablet at noon. Brand: Life extension-High Potency Multivitamin and Mineral Supplement   naproxen sodium (ALEVE) 220 MG tablet Take 440 mg by mouth daily as needed (pain).   Omega-3 Fatty Acids (FISH OIL) 500 MG CAPS Take 1 each by mouth daily at 12 noon.   OVER THE COUNTER MEDICATION Calcium   Probiotic Product (PROBIOTIC PO) Take 1 tablet by mouth daily at 12 noon. 15 strains/25 billion microorganisms   senna (SENOKOT) 8.6 MG TABS tablet Take 2 tablets by mouth as needed for mild constipation.   sodium chloride 1 g tablet Take 1 g by mouth daily.   sulfamethoxazole-trimethoprim (BACTRIM DS) 800-160 MG tablet Take 1 tablet by mouth 2 (two) times daily.    Allergies  Allergen Reactions   Codeine Shortness Of Breath   Penicillins Swelling and Rash    Did it involve swelling of the face/tongue/throat, SOB, or low BP? No Did it involve sudden or severe rash/hives, skin peeling, or any reaction on the inside of your mouth or nose? No Did you need to seek medical attention at a hospital or doctor's office? No When did it last happen?      childhood If all above answers are "NO", may proceed with cephalosporin use.   Latex Other (See Comments)    blisters    SOCIAL HISTORY/FAMILY HISTORY   Reviewed in Epic:  Pertinent findings:  Social History   Tobacco Use   Smoking status: Never   Smokeless tobacco: Never  Vaping Use   Vaping Use: Never used  Substance Use Topics   Alcohol use: Never   Drug use: Never   Social History   Social History Narrative   Recently moved from [place], [place] to [place] area to be close to her daughter and son-in-law.    She currently lives with her husband (a retired Theme park manager).  Husband notes caregiver burnout issues.       OBJCTIVE -PE, EKG, labs   Wt Readings  from Last 3 Encounters:  09/08/21 160 lb (72.6 kg)  12/28/19 150 lb (68 kg)  06/15/19 148 lb (67.1 kg)    Physical Exam: BP 132/72 (BP Location: Left Arm, Patient Position: Sitting, Cuff Size: Large)   Cruz 66   Ht '5\' 6"'$  (1.676 m)   Wt 160 lb (72.6 kg) Comment: 05/2021  SpO2 95%   BMI 25.82 kg/m  Physical Exam Vitals reviewed.  Constitutional:      Appearance: She is normal weight. She is ill-appearing (Frail, chronically ill, weak and debilitated.  Nontoxic.). She is not toxic-appearing.  HENT:     Head: Normocephalic and atraumatic.  Cardiovascular:     Rate and Rhythm: Normal rate and regular rhythm. Occasional Extrasystoles are present.    Chest Wall: PMI is not displaced.     Pulses: Normal pulses.     Heart sounds: S1 normal and S2 normal. Heart sounds are distant. Murmur (1/6 SEM at RUSB.) heard.    No friction rub. No gallop.  Pulmonary:     Effort: Pulmonary effort is normal. No respiratory distress.     Breath sounds: Normal breath sounds. No wheezing, rhonchi or rales.  Musculoskeletal:        General: Swelling (Trivial) present.     Cervical back: Normal range of motion and neck supple.  Neurological:     General: No focal deficit present.     Mental Status: She is alert.     Gait: Abnormal gait: In a wheelchair.     Comments: Alert and oriented x2 person and place  Psychiatric:     Comments: Poor historian.  Does not usually answer the questions.     Adult ECG Report  Rate: 64 ;  Rhythm: normal sinus rhythm, premature ventricular contractions (PVC), and versus PAC with aberrancy.  Some artifact involvement as well.  Nonspecific ST-T wave changes. ;   Narrative Interpretation: Relatively stable.  Recent Labs:  Reviewed  May 20, 2021 Cr 0.87, K+ 4.1. TC 337, TG 198, HDL 6 45 8, LDL 156-no longer on statin No  results found for: CHOL, HDL, LDLCALC, LDLDIRECT, TRIG, CHOLHDL Lab Results  Component Value Date   CREATININE 0.73 11/14/2018   BUN 19 11/14/2018   NA 140 11/14/2018   K 3.7 11/14/2018   CL 105 11/14/2018   CO2 26 11/14/2018      Latest Ref Rng & Units 11/14/2018    5:55 AM  CBC  WBC 4.0 - 10.5 K/uL 12.8    Hemoglobin 12.0 - 15.0 g/dL 13.0    Hematocrit 36.0 - 46.0 % 41.4    Platelets 150 - 400 K/uL 174      No results found for: HGBA1C No results found for: TSH  ==================================================  COVID-19 Education: The signs and symptoms of COVID-19 were discussed with the patient and how to seek care for testing (follow up with PCP or arrange E-visit).    I spent a total of 18 minutes with the patient spent in direct patient consultation.  Additional time spent with chart review  / charting (studies, outside notes, etc): 13 min Total Time: 31 min  Current medicines are reviewed at length with the patient today.  (+/- concerns) none  Notice: This dictation was prepared with Dragon dictation along with smart phrase technology. Any transcriptional errors that result from this process are unintentional and may not be corrected upon review.  Studies Ordered:   Orders Placed This Encounter  Procedures   EKG 12-Lead   No orders  of the defined types were placed in this encounter.   Patient Instructions / Medication Changes & Studies & Tests Ordered   Patient Instructions  Medication Instructions:  No changes  *If you need a refill on your cardiac medications before your next appointment, please call your pharmacy*   Lab Work: Not needed If you have labs (blood work) drawn today and your tests are completely normal, you will receive your results only by: MyChart Message (if you have MyChart) OR A paper copy in the mail If you have any lab test that is abnormal or we need to change your treatment, we will call you to review the  results.   Testing/Procedures: Not needed   Follow-Up: At [facility], you and your health needs are our priority.  As part of our continuing mission to provide you with exceptional heart care, we have created designated Provider Care Teams.  These Care Teams include your primary Cardiologist (physician) and Advanced Practice Providers (APPs -  Physician Assistants and Nurse Practitioners) who all work together to provide you with the care you need, when you need it.     Your next appointment:   12 month(s)  The format for your next appointment:   In Person  Provider:   Glenetta Hew, MD    Other instructions   Exercise      Glenetta Hew, M.D., M.S. Interventional Cardiologist   Pager # [phone] Phone # [phone] [address] [place], [place] [address]   Thank you for choosing Heartcare at [facility]!!

## 2021-09-08 NOTE — Patient Instructions (Addendum)
Medication Instructions:  ?No changes ? ?*If you need a refill on your cardiac medications before your next appointment, please call your pharmacy* ? ? ?Lab Work: ?Not needed ?If you have labs (blood work) drawn today and your tests are completely normal, you will receive your results only by: ?MyChart Message (if you have MyChart) OR ?A paper copy in the mail ?If you have any lab test that is abnormal or we need to change your treatment, we will call you to review the results. ? ? ?Testing/Procedures: ?Not needed ? ? ?Follow-Up: ?At [facility], you and your health needs are our priority.  As part of our continuing mission to provide you with exceptional heart care, we have created designated Provider Care Teams.  These Care Teams include your primary Cardiologist (physician) and Advanced Practice Providers (APPs -  Physician Assistants and Nurse Practitioners) who all work together to provide you with the care you need, when you need it. ? ?  ? ?Your next appointment:   ?12 month(s) ? ?The format for your next appointment:   ?In Person ? ?Provider:   ?Glenetta Hew, MD  ? ? ?Other instructions  ? ?Exercise  ?

## 2021-09-17 ENCOUNTER — Telehealth: Payer: Self-pay | Admitting: Cardiology

## 2021-09-17 DIAGNOSIS — R600 Localized edema: Secondary | ICD-10-CM

## 2021-09-17 DIAGNOSIS — I2510 Atherosclerotic heart disease of native coronary artery without angina pectoris: Secondary | ICD-10-CM

## 2021-09-17 DIAGNOSIS — M940 Chondrocostal junction syndrome [Tietze]: Secondary | ICD-10-CM

## 2021-09-17 NOTE — Telephone Encounter (Signed)
Sandra Cruz would like to know if she could have a verbal order from Dr. Ellyn Hack for pt to receive Physical and Occupational Therapy. Please advise ?

## 2021-09-17 NOTE — Telephone Encounter (Signed)
Call back to say dont need paperwork can call back with verbal. Please advise ?

## 2021-09-17 NOTE — Telephone Encounter (Signed)
Attempted to return call, left message that message will be forwarded to Dr. Ellyn Hack for him to review and advise on home health orders.  ?

## 2021-09-17 NOTE — Telephone Encounter (Signed)
Will have to review next week if papers. ?Rounding in hospital this week - not back in clinic til next week. ?DH ?

## 2021-09-19 ENCOUNTER — Encounter: Payer: Self-pay | Admitting: Cardiology

## 2021-09-19 DIAGNOSIS — Z20822 Contact with and (suspected) exposure to covid-19: Secondary | ICD-10-CM | POA: Diagnosis not present

## 2021-09-22 DIAGNOSIS — Z20822 Contact with and (suspected) exposure to covid-19: Secondary | ICD-10-CM | POA: Diagnosis not present

## 2021-09-22 NOTE — Telephone Encounter (Signed)
Perfectly Happy to Give order for Rehab/PT or whatever is needed. ? ?DH ?

## 2021-09-23 NOTE — Telephone Encounter (Signed)
PER Dr Ellyn Hack ,okay for PHYSICAL AND OCCUPATIONAL THERAPY. ? ? RN spoke to  [employer] with [facility] > She was informed that Dr Ellyn Hack will do order for  therapy. ? Orders placed in Epic.  ?Sheryl states she will be able to completed  ordered once placed if not she will send a written order for Dr Ellyn Hack to sign. ?

## 2021-09-24 DIAGNOSIS — M170 Bilateral primary osteoarthritis of knee: Secondary | ICD-10-CM | POA: Diagnosis not present

## 2021-09-29 DIAGNOSIS — I2510 Atherosclerotic heart disease of native coronary artery without angina pectoris: Secondary | ICD-10-CM | POA: Diagnosis not present

## 2021-09-29 DIAGNOSIS — M170 Bilateral primary osteoarthritis of knee: Secondary | ICD-10-CM | POA: Diagnosis not present

## 2021-09-29 DIAGNOSIS — Z96641 Presence of right artificial hip joint: Secondary | ICD-10-CM | POA: Diagnosis not present

## 2021-09-29 DIAGNOSIS — M160 Bilateral primary osteoarthritis of hip: Secondary | ICD-10-CM | POA: Diagnosis not present

## 2021-09-29 DIAGNOSIS — I10 Essential (primary) hypertension: Secondary | ICD-10-CM | POA: Diagnosis not present

## 2021-09-29 DIAGNOSIS — F0390 Unspecified dementia without behavioral disturbance: Secondary | ICD-10-CM | POA: Diagnosis not present

## 2021-09-29 DIAGNOSIS — M19041 Primary osteoarthritis, right hand: Secondary | ICD-10-CM | POA: Diagnosis not present

## 2021-09-29 DIAGNOSIS — M940 Chondrocostal junction syndrome [Tietze]: Secondary | ICD-10-CM | POA: Diagnosis not present

## 2021-09-29 DIAGNOSIS — E782 Mixed hyperlipidemia: Secondary | ICD-10-CM | POA: Diagnosis not present

## 2021-09-29 DIAGNOSIS — M810 Age-related osteoporosis without current pathological fracture: Secondary | ICD-10-CM | POA: Diagnosis not present

## 2021-09-29 DIAGNOSIS — M19042 Primary osteoarthritis, left hand: Secondary | ICD-10-CM | POA: Diagnosis not present

## 2021-09-29 DIAGNOSIS — H3530 Unspecified macular degeneration: Secondary | ICD-10-CM | POA: Diagnosis not present

## 2021-09-29 DIAGNOSIS — Z9181 History of falling: Secondary | ICD-10-CM | POA: Diagnosis not present

## 2021-09-29 DIAGNOSIS — Z853 Personal history of malignant neoplasm of breast: Secondary | ICD-10-CM | POA: Diagnosis not present

## 2021-10-01 ENCOUNTER — Telehealth: Payer: Self-pay | Admitting: Cardiology

## 2021-10-01 NOTE — Telephone Encounter (Signed)
Spoke with Sandra Cruz from [facility].  She did the home evaluation, and needs an order for physical therapy 1x for 2weeks, 2x for 3 weeks, 1x for 4 weeks.  Family is declining OT. She wants to approval for an order and then she will fax the from to be signed by Dr. Ellyn Hack. ?

## 2021-10-01 NOTE — Telephone Encounter (Signed)
Mickel Baas from [facility] called stating she did the home evaluation, she need an order for physical therapy 1x for 2weeks, 2x for 3 weeks, 1x for 4 weeks.  Family is declining OT.  ?

## 2021-10-06 DIAGNOSIS — F0390 Unspecified dementia without behavioral disturbance: Secondary | ICD-10-CM | POA: Diagnosis not present

## 2021-10-06 DIAGNOSIS — M170 Bilateral primary osteoarthritis of knee: Secondary | ICD-10-CM | POA: Diagnosis not present

## 2021-10-06 DIAGNOSIS — M160 Bilateral primary osteoarthritis of hip: Secondary | ICD-10-CM | POA: Diagnosis not present

## 2021-10-06 DIAGNOSIS — I2510 Atherosclerotic heart disease of native coronary artery without angina pectoris: Secondary | ICD-10-CM | POA: Diagnosis not present

## 2021-10-06 DIAGNOSIS — M19041 Primary osteoarthritis, right hand: Secondary | ICD-10-CM | POA: Diagnosis not present

## 2021-10-06 DIAGNOSIS — I10 Essential (primary) hypertension: Secondary | ICD-10-CM | POA: Diagnosis not present

## 2021-10-16 NOTE — Telephone Encounter (Signed)
Okay with order  for PT per Dr Ellyn Hack.  Will send order for Dr Ellyn Hack to signed

## 2021-10-18 ENCOUNTER — Encounter: Payer: Self-pay | Admitting: Cardiology

## 2021-10-18 NOTE — Assessment & Plan Note (Addendum)
Blood pressure looks good today.  We will not be overly aggressive blood pressure.  She is on low-dose of amlodipine and carvedilol both taken BID.  If she is dizzy woozy lightheaded, etc., would hold a dose.

## 2021-10-18 NOTE — Assessment & Plan Note (Addendum)
Nonocclusive disease.  For reasons as mentioned above, we are avoiding statin.  She is on low-dose carvedilol and low-dose amlodipine.  No active symptoms. She is not on aspirin because of fall history.  Will defer to PCP associated to restarting aspirin.  Continue to encourage increased exercise

## 2021-10-18 NOTE — Assessment & Plan Note (Signed)
No longer on statin.  I think at this point with her memory issues and fatigue etc., the risks of medication outweigh the benefits.  She had relative nonobstructive disease for years ago.  She would not be interested in any further invasive procedures.

## 2021-10-27 DIAGNOSIS — M170 Bilateral primary osteoarthritis of knee: Secondary | ICD-10-CM | POA: Diagnosis not present

## 2021-10-27 DIAGNOSIS — M19041 Primary osteoarthritis, right hand: Secondary | ICD-10-CM | POA: Diagnosis not present

## 2021-10-27 DIAGNOSIS — F0390 Unspecified dementia without behavioral disturbance: Secondary | ICD-10-CM | POA: Diagnosis not present

## 2021-10-27 DIAGNOSIS — I2510 Atherosclerotic heart disease of native coronary artery without angina pectoris: Secondary | ICD-10-CM | POA: Diagnosis not present

## 2021-10-27 DIAGNOSIS — M160 Bilateral primary osteoarthritis of hip: Secondary | ICD-10-CM | POA: Diagnosis not present

## 2021-10-27 DIAGNOSIS — I10 Essential (primary) hypertension: Secondary | ICD-10-CM | POA: Diagnosis not present

## 2021-10-29 DIAGNOSIS — E782 Mixed hyperlipidemia: Secondary | ICD-10-CM | POA: Diagnosis not present

## 2021-10-29 DIAGNOSIS — F0390 Unspecified dementia without behavioral disturbance: Secondary | ICD-10-CM | POA: Diagnosis not present

## 2021-10-29 DIAGNOSIS — M19041 Primary osteoarthritis, right hand: Secondary | ICD-10-CM | POA: Diagnosis not present

## 2021-10-29 DIAGNOSIS — M170 Bilateral primary osteoarthritis of knee: Secondary | ICD-10-CM | POA: Diagnosis not present

## 2021-10-29 DIAGNOSIS — H3530 Unspecified macular degeneration: Secondary | ICD-10-CM | POA: Diagnosis not present

## 2021-10-29 DIAGNOSIS — I10 Essential (primary) hypertension: Secondary | ICD-10-CM | POA: Diagnosis not present

## 2021-10-29 DIAGNOSIS — I2510 Atherosclerotic heart disease of native coronary artery without angina pectoris: Secondary | ICD-10-CM | POA: Diagnosis not present

## 2021-10-29 DIAGNOSIS — Z96641 Presence of right artificial hip joint: Secondary | ICD-10-CM | POA: Diagnosis not present

## 2021-10-29 DIAGNOSIS — M160 Bilateral primary osteoarthritis of hip: Secondary | ICD-10-CM | POA: Diagnosis not present

## 2021-10-29 DIAGNOSIS — M940 Chondrocostal junction syndrome [Tietze]: Secondary | ICD-10-CM | POA: Diagnosis not present

## 2021-10-29 DIAGNOSIS — Z853 Personal history of malignant neoplasm of breast: Secondary | ICD-10-CM | POA: Diagnosis not present

## 2021-10-29 DIAGNOSIS — M19042 Primary osteoarthritis, left hand: Secondary | ICD-10-CM | POA: Diagnosis not present

## 2021-10-29 DIAGNOSIS — Z9181 History of falling: Secondary | ICD-10-CM | POA: Diagnosis not present

## 2021-10-29 DIAGNOSIS — M810 Age-related osteoporosis without current pathological fracture: Secondary | ICD-10-CM | POA: Diagnosis not present

## 2021-10-30 ENCOUNTER — Telehealth: Payer: Self-pay | Admitting: Cardiology

## 2021-10-30 NOTE — Telephone Encounter (Signed)
Called the provided number, no answer. Voicemail full. Unable to leave message.

## 2021-10-30 NOTE — Telephone Encounter (Signed)
Sandra Cruz - [facility] calling to report 2 missed visit last week. She said it was on the 7th and the 9th due to schedule conflict per pt's daughter.

## 2021-10-31 DIAGNOSIS — R399 Unspecified symptoms and signs involving the genitourinary system: Secondary | ICD-10-CM | POA: Diagnosis not present

## 2021-11-03 DIAGNOSIS — N390 Urinary tract infection, site not specified: Secondary | ICD-10-CM | POA: Diagnosis not present

## 2021-11-03 DIAGNOSIS — G4700 Insomnia, unspecified: Secondary | ICD-10-CM | POA: Diagnosis not present

## 2021-11-03 DIAGNOSIS — L309 Dermatitis, unspecified: Secondary | ICD-10-CM | POA: Diagnosis not present

## 2021-11-05 DIAGNOSIS — M170 Bilateral primary osteoarthritis of knee: Secondary | ICD-10-CM | POA: Diagnosis not present

## 2021-11-05 DIAGNOSIS — M19041 Primary osteoarthritis, right hand: Secondary | ICD-10-CM | POA: Diagnosis not present

## 2021-11-05 DIAGNOSIS — M160 Bilateral primary osteoarthritis of hip: Secondary | ICD-10-CM | POA: Diagnosis not present

## 2021-11-05 DIAGNOSIS — F0390 Unspecified dementia without behavioral disturbance: Secondary | ICD-10-CM | POA: Diagnosis not present

## 2021-11-05 DIAGNOSIS — I2510 Atherosclerotic heart disease of native coronary artery without angina pectoris: Secondary | ICD-10-CM | POA: Diagnosis not present

## 2021-11-05 DIAGNOSIS — I10 Essential (primary) hypertension: Secondary | ICD-10-CM | POA: Diagnosis not present

## 2021-11-14 DIAGNOSIS — M170 Bilateral primary osteoarthritis of knee: Secondary | ICD-10-CM | POA: Diagnosis not present

## 2021-11-14 DIAGNOSIS — I10 Essential (primary) hypertension: Secondary | ICD-10-CM | POA: Diagnosis not present

## 2021-11-14 DIAGNOSIS — I2510 Atherosclerotic heart disease of native coronary artery without angina pectoris: Secondary | ICD-10-CM | POA: Diagnosis not present

## 2021-11-14 DIAGNOSIS — M160 Bilateral primary osteoarthritis of hip: Secondary | ICD-10-CM | POA: Diagnosis not present

## 2021-11-14 DIAGNOSIS — M19041 Primary osteoarthritis, right hand: Secondary | ICD-10-CM | POA: Diagnosis not present

## 2021-11-14 DIAGNOSIS — F0390 Unspecified dementia without behavioral disturbance: Secondary | ICD-10-CM | POA: Diagnosis not present

## 2021-11-25 DIAGNOSIS — Z7409 Other reduced mobility: Secondary | ICD-10-CM | POA: Diagnosis not present

## 2021-11-25 DIAGNOSIS — E785 Hyperlipidemia, unspecified: Secondary | ICD-10-CM | POA: Diagnosis not present

## 2021-11-25 DIAGNOSIS — M179 Osteoarthritis of knee, unspecified: Secondary | ICD-10-CM | POA: Diagnosis not present

## 2021-11-25 DIAGNOSIS — I10 Essential (primary) hypertension: Secondary | ICD-10-CM | POA: Diagnosis not present

## 2021-11-25 DIAGNOSIS — F0390 Unspecified dementia without behavioral disturbance: Secondary | ICD-10-CM | POA: Diagnosis not present

## 2021-11-27 DIAGNOSIS — M19041 Primary osteoarthritis, right hand: Secondary | ICD-10-CM | POA: Diagnosis not present

## 2021-11-27 DIAGNOSIS — I10 Essential (primary) hypertension: Secondary | ICD-10-CM | POA: Diagnosis not present

## 2021-11-27 DIAGNOSIS — M170 Bilateral primary osteoarthritis of knee: Secondary | ICD-10-CM | POA: Diagnosis not present

## 2021-11-27 DIAGNOSIS — M160 Bilateral primary osteoarthritis of hip: Secondary | ICD-10-CM | POA: Diagnosis not present

## 2021-11-27 DIAGNOSIS — F0390 Unspecified dementia without behavioral disturbance: Secondary | ICD-10-CM | POA: Diagnosis not present

## 2021-11-27 DIAGNOSIS — I2510 Atherosclerotic heart disease of native coronary artery without angina pectoris: Secondary | ICD-10-CM | POA: Diagnosis not present

## 2021-11-28 ENCOUNTER — Telehealth: Payer: Self-pay | Admitting: Cardiology

## 2021-11-28 DIAGNOSIS — M19041 Primary osteoarthritis, right hand: Secondary | ICD-10-CM | POA: Diagnosis not present

## 2021-11-28 DIAGNOSIS — M170 Bilateral primary osteoarthritis of knee: Secondary | ICD-10-CM | POA: Diagnosis not present

## 2021-11-28 DIAGNOSIS — Z853 Personal history of malignant neoplasm of breast: Secondary | ICD-10-CM | POA: Diagnosis not present

## 2021-11-28 DIAGNOSIS — M19042 Primary osteoarthritis, left hand: Secondary | ICD-10-CM | POA: Diagnosis not present

## 2021-11-28 DIAGNOSIS — H3530 Unspecified macular degeneration: Secondary | ICD-10-CM | POA: Diagnosis not present

## 2021-11-28 DIAGNOSIS — Z9181 History of falling: Secondary | ICD-10-CM | POA: Diagnosis not present

## 2021-11-28 DIAGNOSIS — I10 Essential (primary) hypertension: Secondary | ICD-10-CM | POA: Diagnosis not present

## 2021-11-28 DIAGNOSIS — M810 Age-related osteoporosis without current pathological fracture: Secondary | ICD-10-CM | POA: Diagnosis not present

## 2021-11-28 DIAGNOSIS — F0390 Unspecified dementia without behavioral disturbance: Secondary | ICD-10-CM | POA: Diagnosis not present

## 2021-11-28 DIAGNOSIS — M940 Chondrocostal junction syndrome [Tietze]: Secondary | ICD-10-CM | POA: Diagnosis not present

## 2021-11-28 DIAGNOSIS — Z96641 Presence of right artificial hip joint: Secondary | ICD-10-CM | POA: Diagnosis not present

## 2021-11-28 DIAGNOSIS — M160 Bilateral primary osteoarthritis of hip: Secondary | ICD-10-CM | POA: Diagnosis not present

## 2021-11-28 DIAGNOSIS — I2510 Atherosclerotic heart disease of native coronary artery without angina pectoris: Secondary | ICD-10-CM | POA: Diagnosis not present

## 2021-11-28 DIAGNOSIS — E782 Mixed hyperlipidemia: Secondary | ICD-10-CM | POA: Diagnosis not present

## 2021-11-28 NOTE — Telephone Encounter (Signed)
New Message:     Mickel Baas called. She needs an order for a PT  for 1 time a month for 2 months and an order for a OT evaluation please. . She said you can laeve a message with a verbal order.

## 2021-11-28 NOTE — Telephone Encounter (Signed)
Routed to MD to review

## 2021-11-29 NOTE — Telephone Encounter (Signed)
Somehow, I ended up being in the MD to sign her orders for rehab.  Truly think this is a PCP thing to do, but I did not feel like making a big deal of it .  Verbal order given for her however they need to do her rehab.Glenetta Hew, MD

## 2021-12-01 NOTE — Telephone Encounter (Signed)
Verbal order given  

## 2021-12-02 ENCOUNTER — Telehealth: Payer: Self-pay | Admitting: Family Medicine

## 2021-12-02 NOTE — Telephone Encounter (Signed)
At 11:37 pt's daughter left a vm stating pt needs to see Dr Jaynee Eagles but it would be her preference for her mother to have a my chart vv.  Daughter is asking for a call from RN to know if Dr Jaynee Eagles will allow a my chart or if pt really needs to come in.  Daughter also stated that they are now using the bus since pt is unable to get into the family car, please call.

## 2021-12-02 NOTE — Telephone Encounter (Signed)
I called daughter back.  She stated pt has been stable on her medications which her pcp is filling for her.  No change.  I relayed that we see yearly for med refills, but last appt was to see [place] as needed and get refills from pcp if ok to refill.  If at anytime we need to see be glad to do so.   Both her parents living with her.

## 2021-12-11 DIAGNOSIS — F0390 Unspecified dementia without behavioral disturbance: Secondary | ICD-10-CM | POA: Diagnosis not present

## 2021-12-11 DIAGNOSIS — M160 Bilateral primary osteoarthritis of hip: Secondary | ICD-10-CM | POA: Diagnosis not present

## 2021-12-11 DIAGNOSIS — M19042 Primary osteoarthritis, left hand: Secondary | ICD-10-CM | POA: Diagnosis not present

## 2021-12-11 DIAGNOSIS — M19041 Primary osteoarthritis, right hand: Secondary | ICD-10-CM | POA: Diagnosis not present

## 2021-12-11 DIAGNOSIS — I10 Essential (primary) hypertension: Secondary | ICD-10-CM | POA: Diagnosis not present

## 2021-12-11 DIAGNOSIS — M170 Bilateral primary osteoarthritis of knee: Secondary | ICD-10-CM | POA: Diagnosis not present

## 2021-12-16 DIAGNOSIS — N390 Urinary tract infection, site not specified: Secondary | ICD-10-CM | POA: Diagnosis not present

## 2021-12-16 DIAGNOSIS — M19041 Primary osteoarthritis, right hand: Secondary | ICD-10-CM | POA: Diagnosis not present

## 2021-12-16 DIAGNOSIS — M19042 Primary osteoarthritis, left hand: Secondary | ICD-10-CM | POA: Diagnosis not present

## 2021-12-16 DIAGNOSIS — M170 Bilateral primary osteoarthritis of knee: Secondary | ICD-10-CM | POA: Diagnosis not present

## 2021-12-16 DIAGNOSIS — I10 Essential (primary) hypertension: Secondary | ICD-10-CM | POA: Diagnosis not present

## 2021-12-16 DIAGNOSIS — M160 Bilateral primary osteoarthritis of hip: Secondary | ICD-10-CM | POA: Diagnosis not present

## 2021-12-16 DIAGNOSIS — F0390 Unspecified dementia without behavioral disturbance: Secondary | ICD-10-CM | POA: Diagnosis not present

## 2021-12-18 DIAGNOSIS — F0390 Unspecified dementia without behavioral disturbance: Secondary | ICD-10-CM | POA: Diagnosis not present

## 2021-12-18 DIAGNOSIS — I10 Essential (primary) hypertension: Secondary | ICD-10-CM | POA: Diagnosis not present

## 2021-12-18 DIAGNOSIS — M19041 Primary osteoarthritis, right hand: Secondary | ICD-10-CM | POA: Diagnosis not present

## 2021-12-18 DIAGNOSIS — M170 Bilateral primary osteoarthritis of knee: Secondary | ICD-10-CM | POA: Diagnosis not present

## 2021-12-18 DIAGNOSIS — M19042 Primary osteoarthritis, left hand: Secondary | ICD-10-CM | POA: Diagnosis not present

## 2021-12-18 DIAGNOSIS — M160 Bilateral primary osteoarthritis of hip: Secondary | ICD-10-CM | POA: Diagnosis not present

## 2021-12-24 DIAGNOSIS — I10 Essential (primary) hypertension: Secondary | ICD-10-CM | POA: Diagnosis not present

## 2021-12-24 DIAGNOSIS — M160 Bilateral primary osteoarthritis of hip: Secondary | ICD-10-CM | POA: Diagnosis not present

## 2021-12-24 DIAGNOSIS — M170 Bilateral primary osteoarthritis of knee: Secondary | ICD-10-CM | POA: Diagnosis not present

## 2021-12-24 DIAGNOSIS — M19041 Primary osteoarthritis, right hand: Secondary | ICD-10-CM | POA: Diagnosis not present

## 2021-12-24 DIAGNOSIS — M19042 Primary osteoarthritis, left hand: Secondary | ICD-10-CM | POA: Diagnosis not present

## 2021-12-24 DIAGNOSIS — F0390 Unspecified dementia without behavioral disturbance: Secondary | ICD-10-CM | POA: Diagnosis not present

## 2021-12-25 DIAGNOSIS — M19042 Primary osteoarthritis, left hand: Secondary | ICD-10-CM | POA: Diagnosis not present

## 2021-12-25 DIAGNOSIS — F0390 Unspecified dementia without behavioral disturbance: Secondary | ICD-10-CM | POA: Diagnosis not present

## 2021-12-25 DIAGNOSIS — M19041 Primary osteoarthritis, right hand: Secondary | ICD-10-CM | POA: Diagnosis not present

## 2021-12-25 DIAGNOSIS — M160 Bilateral primary osteoarthritis of hip: Secondary | ICD-10-CM | POA: Diagnosis not present

## 2021-12-25 DIAGNOSIS — M170 Bilateral primary osteoarthritis of knee: Secondary | ICD-10-CM | POA: Diagnosis not present

## 2021-12-25 DIAGNOSIS — I10 Essential (primary) hypertension: Secondary | ICD-10-CM | POA: Diagnosis not present

## 2021-12-28 DIAGNOSIS — M19042 Primary osteoarthritis, left hand: Secondary | ICD-10-CM | POA: Diagnosis not present

## 2021-12-28 DIAGNOSIS — M170 Bilateral primary osteoarthritis of knee: Secondary | ICD-10-CM | POA: Diagnosis not present

## 2021-12-28 DIAGNOSIS — E782 Mixed hyperlipidemia: Secondary | ICD-10-CM | POA: Diagnosis not present

## 2021-12-28 DIAGNOSIS — Z96641 Presence of right artificial hip joint: Secondary | ICD-10-CM | POA: Diagnosis not present

## 2021-12-28 DIAGNOSIS — H3530 Unspecified macular degeneration: Secondary | ICD-10-CM | POA: Diagnosis not present

## 2021-12-28 DIAGNOSIS — I2510 Atherosclerotic heart disease of native coronary artery without angina pectoris: Secondary | ICD-10-CM | POA: Diagnosis not present

## 2021-12-28 DIAGNOSIS — F0390 Unspecified dementia without behavioral disturbance: Secondary | ICD-10-CM | POA: Diagnosis not present

## 2021-12-28 DIAGNOSIS — M160 Bilateral primary osteoarthritis of hip: Secondary | ICD-10-CM | POA: Diagnosis not present

## 2021-12-28 DIAGNOSIS — Z9181 History of falling: Secondary | ICD-10-CM | POA: Diagnosis not present

## 2021-12-28 DIAGNOSIS — Z853 Personal history of malignant neoplasm of breast: Secondary | ICD-10-CM | POA: Diagnosis not present

## 2021-12-28 DIAGNOSIS — I10 Essential (primary) hypertension: Secondary | ICD-10-CM | POA: Diagnosis not present

## 2021-12-28 DIAGNOSIS — M940 Chondrocostal junction syndrome [Tietze]: Secondary | ICD-10-CM | POA: Diagnosis not present

## 2021-12-28 DIAGNOSIS — M810 Age-related osteoporosis without current pathological fracture: Secondary | ICD-10-CM | POA: Diagnosis not present

## 2021-12-28 DIAGNOSIS — M19041 Primary osteoarthritis, right hand: Secondary | ICD-10-CM | POA: Diagnosis not present

## 2021-12-30 DIAGNOSIS — I10 Essential (primary) hypertension: Secondary | ICD-10-CM | POA: Diagnosis not present

## 2021-12-30 DIAGNOSIS — M170 Bilateral primary osteoarthritis of knee: Secondary | ICD-10-CM | POA: Diagnosis not present

## 2021-12-30 DIAGNOSIS — F0390 Unspecified dementia without behavioral disturbance: Secondary | ICD-10-CM | POA: Diagnosis not present

## 2021-12-30 DIAGNOSIS — M160 Bilateral primary osteoarthritis of hip: Secondary | ICD-10-CM | POA: Diagnosis not present

## 2021-12-30 DIAGNOSIS — M19041 Primary osteoarthritis, right hand: Secondary | ICD-10-CM | POA: Diagnosis not present

## 2021-12-30 DIAGNOSIS — M19042 Primary osteoarthritis, left hand: Secondary | ICD-10-CM | POA: Diagnosis not present

## 2021-12-31 DIAGNOSIS — F0390 Unspecified dementia without behavioral disturbance: Secondary | ICD-10-CM | POA: Diagnosis not present

## 2021-12-31 DIAGNOSIS — M19041 Primary osteoarthritis, right hand: Secondary | ICD-10-CM | POA: Diagnosis not present

## 2021-12-31 DIAGNOSIS — M170 Bilateral primary osteoarthritis of knee: Secondary | ICD-10-CM | POA: Diagnosis not present

## 2021-12-31 DIAGNOSIS — M160 Bilateral primary osteoarthritis of hip: Secondary | ICD-10-CM | POA: Diagnosis not present

## 2021-12-31 DIAGNOSIS — I10 Essential (primary) hypertension: Secondary | ICD-10-CM | POA: Diagnosis not present

## 2021-12-31 DIAGNOSIS — M19042 Primary osteoarthritis, left hand: Secondary | ICD-10-CM | POA: Diagnosis not present

## 2022-01-08 DIAGNOSIS — M170 Bilateral primary osteoarthritis of knee: Secondary | ICD-10-CM | POA: Diagnosis not present

## 2022-01-08 DIAGNOSIS — M19042 Primary osteoarthritis, left hand: Secondary | ICD-10-CM | POA: Diagnosis not present

## 2022-01-08 DIAGNOSIS — F0390 Unspecified dementia without behavioral disturbance: Secondary | ICD-10-CM | POA: Diagnosis not present

## 2022-01-08 DIAGNOSIS — M19041 Primary osteoarthritis, right hand: Secondary | ICD-10-CM | POA: Diagnosis not present

## 2022-01-08 DIAGNOSIS — M160 Bilateral primary osteoarthritis of hip: Secondary | ICD-10-CM | POA: Diagnosis not present

## 2022-01-08 DIAGNOSIS — I10 Essential (primary) hypertension: Secondary | ICD-10-CM | POA: Diagnosis not present

## 2022-01-14 DIAGNOSIS — F0390 Unspecified dementia without behavioral disturbance: Secondary | ICD-10-CM | POA: Diagnosis not present

## 2022-01-14 DIAGNOSIS — M19042 Primary osteoarthritis, left hand: Secondary | ICD-10-CM | POA: Diagnosis not present

## 2022-01-14 DIAGNOSIS — I10 Essential (primary) hypertension: Secondary | ICD-10-CM | POA: Diagnosis not present

## 2022-01-14 DIAGNOSIS — M170 Bilateral primary osteoarthritis of knee: Secondary | ICD-10-CM | POA: Diagnosis not present

## 2022-01-14 DIAGNOSIS — M160 Bilateral primary osteoarthritis of hip: Secondary | ICD-10-CM | POA: Diagnosis not present

## 2022-01-14 DIAGNOSIS — M19041 Primary osteoarthritis, right hand: Secondary | ICD-10-CM | POA: Diagnosis not present

## 2022-01-22 DIAGNOSIS — M160 Bilateral primary osteoarthritis of hip: Secondary | ICD-10-CM | POA: Diagnosis not present

## 2022-01-22 DIAGNOSIS — M19041 Primary osteoarthritis, right hand: Secondary | ICD-10-CM | POA: Diagnosis not present

## 2022-01-22 DIAGNOSIS — I10 Essential (primary) hypertension: Secondary | ICD-10-CM | POA: Diagnosis not present

## 2022-01-22 DIAGNOSIS — F0390 Unspecified dementia without behavioral disturbance: Secondary | ICD-10-CM | POA: Diagnosis not present

## 2022-01-22 DIAGNOSIS — M170 Bilateral primary osteoarthritis of knee: Secondary | ICD-10-CM | POA: Diagnosis not present

## 2022-01-22 DIAGNOSIS — M19042 Primary osteoarthritis, left hand: Secondary | ICD-10-CM | POA: Diagnosis not present

## 2022-01-27 DIAGNOSIS — Z9181 History of falling: Secondary | ICD-10-CM | POA: Diagnosis not present

## 2022-01-27 DIAGNOSIS — M170 Bilateral primary osteoarthritis of knee: Secondary | ICD-10-CM | POA: Diagnosis not present

## 2022-01-27 DIAGNOSIS — M810 Age-related osteoporosis without current pathological fracture: Secondary | ICD-10-CM | POA: Diagnosis not present

## 2022-01-27 DIAGNOSIS — I10 Essential (primary) hypertension: Secondary | ICD-10-CM | POA: Diagnosis not present

## 2022-01-27 DIAGNOSIS — F0390 Unspecified dementia without behavioral disturbance: Secondary | ICD-10-CM | POA: Diagnosis not present

## 2022-01-27 DIAGNOSIS — M19042 Primary osteoarthritis, left hand: Secondary | ICD-10-CM | POA: Diagnosis not present

## 2022-01-27 DIAGNOSIS — M940 Chondrocostal junction syndrome [Tietze]: Secondary | ICD-10-CM | POA: Diagnosis not present

## 2022-01-27 DIAGNOSIS — M160 Bilateral primary osteoarthritis of hip: Secondary | ICD-10-CM | POA: Diagnosis not present

## 2022-01-27 DIAGNOSIS — E782 Mixed hyperlipidemia: Secondary | ICD-10-CM | POA: Diagnosis not present

## 2022-01-27 DIAGNOSIS — I2510 Atherosclerotic heart disease of native coronary artery without angina pectoris: Secondary | ICD-10-CM | POA: Diagnosis not present

## 2022-01-27 DIAGNOSIS — Z96641 Presence of right artificial hip joint: Secondary | ICD-10-CM | POA: Diagnosis not present

## 2022-01-27 DIAGNOSIS — Z853 Personal history of malignant neoplasm of breast: Secondary | ICD-10-CM | POA: Diagnosis not present

## 2022-01-27 DIAGNOSIS — M19041 Primary osteoarthritis, right hand: Secondary | ICD-10-CM | POA: Diagnosis not present

## 2022-01-27 DIAGNOSIS — H3530 Unspecified macular degeneration: Secondary | ICD-10-CM | POA: Diagnosis not present

## 2022-01-28 DIAGNOSIS — M19041 Primary osteoarthritis, right hand: Secondary | ICD-10-CM | POA: Diagnosis not present

## 2022-01-28 DIAGNOSIS — M170 Bilateral primary osteoarthritis of knee: Secondary | ICD-10-CM | POA: Diagnosis not present

## 2022-01-28 DIAGNOSIS — I10 Essential (primary) hypertension: Secondary | ICD-10-CM | POA: Diagnosis not present

## 2022-01-28 DIAGNOSIS — M160 Bilateral primary osteoarthritis of hip: Secondary | ICD-10-CM | POA: Diagnosis not present

## 2022-01-28 DIAGNOSIS — F0390 Unspecified dementia without behavioral disturbance: Secondary | ICD-10-CM | POA: Diagnosis not present

## 2022-01-28 DIAGNOSIS — M19042 Primary osteoarthritis, left hand: Secondary | ICD-10-CM | POA: Diagnosis not present

## 2022-01-29 DIAGNOSIS — R300 Dysuria: Secondary | ICD-10-CM | POA: Diagnosis not present

## 2022-02-04 DIAGNOSIS — F0390 Unspecified dementia without behavioral disturbance: Secondary | ICD-10-CM | POA: Diagnosis not present

## 2022-02-04 DIAGNOSIS — M19041 Primary osteoarthritis, right hand: Secondary | ICD-10-CM | POA: Diagnosis not present

## 2022-02-04 DIAGNOSIS — M19042 Primary osteoarthritis, left hand: Secondary | ICD-10-CM | POA: Diagnosis not present

## 2022-02-04 DIAGNOSIS — M170 Bilateral primary osteoarthritis of knee: Secondary | ICD-10-CM | POA: Diagnosis not present

## 2022-02-04 DIAGNOSIS — M160 Bilateral primary osteoarthritis of hip: Secondary | ICD-10-CM | POA: Diagnosis not present

## 2022-02-04 DIAGNOSIS — I10 Essential (primary) hypertension: Secondary | ICD-10-CM | POA: Diagnosis not present

## 2022-02-11 DIAGNOSIS — M170 Bilateral primary osteoarthritis of knee: Secondary | ICD-10-CM | POA: Diagnosis not present

## 2022-02-11 DIAGNOSIS — M19042 Primary osteoarthritis, left hand: Secondary | ICD-10-CM | POA: Diagnosis not present

## 2022-02-11 DIAGNOSIS — M160 Bilateral primary osteoarthritis of hip: Secondary | ICD-10-CM | POA: Diagnosis not present

## 2022-02-11 DIAGNOSIS — M19041 Primary osteoarthritis, right hand: Secondary | ICD-10-CM | POA: Diagnosis not present

## 2022-02-11 DIAGNOSIS — I10 Essential (primary) hypertension: Secondary | ICD-10-CM | POA: Diagnosis not present

## 2022-02-11 DIAGNOSIS — F0390 Unspecified dementia without behavioral disturbance: Secondary | ICD-10-CM | POA: Diagnosis not present

## 2022-02-13 DIAGNOSIS — H353113 Nonexudative age-related macular degeneration, right eye, advanced atrophic without subfoveal involvement: Secondary | ICD-10-CM | POA: Diagnosis not present

## 2022-02-13 DIAGNOSIS — H31092 Other chorioretinal scars, left eye: Secondary | ICD-10-CM | POA: Diagnosis not present

## 2022-02-13 DIAGNOSIS — H43813 Vitreous degeneration, bilateral: Secondary | ICD-10-CM | POA: Diagnosis not present

## 2022-02-13 DIAGNOSIS — H353222 Exudative age-related macular degeneration, left eye, with inactive choroidal neovascularization: Secondary | ICD-10-CM | POA: Diagnosis not present

## 2022-02-19 DIAGNOSIS — I10 Essential (primary) hypertension: Secondary | ICD-10-CM | POA: Diagnosis not present

## 2022-02-19 DIAGNOSIS — F0390 Unspecified dementia without behavioral disturbance: Secondary | ICD-10-CM | POA: Diagnosis not present

## 2022-02-19 DIAGNOSIS — M160 Bilateral primary osteoarthritis of hip: Secondary | ICD-10-CM | POA: Diagnosis not present

## 2022-02-19 DIAGNOSIS — M19042 Primary osteoarthritis, left hand: Secondary | ICD-10-CM | POA: Diagnosis not present

## 2022-02-19 DIAGNOSIS — M19041 Primary osteoarthritis, right hand: Secondary | ICD-10-CM | POA: Diagnosis not present

## 2022-02-19 DIAGNOSIS — M170 Bilateral primary osteoarthritis of knee: Secondary | ICD-10-CM | POA: Diagnosis not present

## 2022-02-25 DIAGNOSIS — M160 Bilateral primary osteoarthritis of hip: Secondary | ICD-10-CM | POA: Diagnosis not present

## 2022-02-25 DIAGNOSIS — F0390 Unspecified dementia without behavioral disturbance: Secondary | ICD-10-CM | POA: Diagnosis not present

## 2022-02-25 DIAGNOSIS — M19041 Primary osteoarthritis, right hand: Secondary | ICD-10-CM | POA: Diagnosis not present

## 2022-02-25 DIAGNOSIS — M170 Bilateral primary osteoarthritis of knee: Secondary | ICD-10-CM | POA: Diagnosis not present

## 2022-02-25 DIAGNOSIS — I10 Essential (primary) hypertension: Secondary | ICD-10-CM | POA: Diagnosis not present

## 2022-02-25 DIAGNOSIS — M19042 Primary osteoarthritis, left hand: Secondary | ICD-10-CM | POA: Diagnosis not present

## 2022-02-26 DIAGNOSIS — E782 Mixed hyperlipidemia: Secondary | ICD-10-CM | POA: Diagnosis not present

## 2022-02-26 DIAGNOSIS — Z96641 Presence of right artificial hip joint: Secondary | ICD-10-CM | POA: Diagnosis not present

## 2022-02-26 DIAGNOSIS — Z853 Personal history of malignant neoplasm of breast: Secondary | ICD-10-CM | POA: Diagnosis not present

## 2022-02-26 DIAGNOSIS — M19041 Primary osteoarthritis, right hand: Secondary | ICD-10-CM | POA: Diagnosis not present

## 2022-02-26 DIAGNOSIS — M810 Age-related osteoporosis without current pathological fracture: Secondary | ICD-10-CM | POA: Diagnosis not present

## 2022-02-26 DIAGNOSIS — Z23 Encounter for immunization: Secondary | ICD-10-CM | POA: Diagnosis not present

## 2022-02-26 DIAGNOSIS — M160 Bilateral primary osteoarthritis of hip: Secondary | ICD-10-CM | POA: Diagnosis not present

## 2022-02-26 DIAGNOSIS — I10 Essential (primary) hypertension: Secondary | ICD-10-CM | POA: Diagnosis not present

## 2022-02-26 DIAGNOSIS — Z9181 History of falling: Secondary | ICD-10-CM | POA: Diagnosis not present

## 2022-02-26 DIAGNOSIS — M170 Bilateral primary osteoarthritis of knee: Secondary | ICD-10-CM | POA: Diagnosis not present

## 2022-02-26 DIAGNOSIS — I2510 Atherosclerotic heart disease of native coronary artery without angina pectoris: Secondary | ICD-10-CM | POA: Diagnosis not present

## 2022-02-26 DIAGNOSIS — F0390 Unspecified dementia without behavioral disturbance: Secondary | ICD-10-CM | POA: Diagnosis not present

## 2022-02-26 DIAGNOSIS — H3530 Unspecified macular degeneration: Secondary | ICD-10-CM | POA: Diagnosis not present

## 2022-02-26 DIAGNOSIS — M940 Chondrocostal junction syndrome [Tietze]: Secondary | ICD-10-CM | POA: Diagnosis not present

## 2022-02-26 DIAGNOSIS — M19042 Primary osteoarthritis, left hand: Secondary | ICD-10-CM | POA: Diagnosis not present

## 2022-03-05 DIAGNOSIS — M160 Bilateral primary osteoarthritis of hip: Secondary | ICD-10-CM | POA: Diagnosis not present

## 2022-03-05 DIAGNOSIS — M19042 Primary osteoarthritis, left hand: Secondary | ICD-10-CM | POA: Diagnosis not present

## 2022-03-05 DIAGNOSIS — M19041 Primary osteoarthritis, right hand: Secondary | ICD-10-CM | POA: Diagnosis not present

## 2022-03-05 DIAGNOSIS — I10 Essential (primary) hypertension: Secondary | ICD-10-CM | POA: Diagnosis not present

## 2022-03-05 DIAGNOSIS — M170 Bilateral primary osteoarthritis of knee: Secondary | ICD-10-CM | POA: Diagnosis not present

## 2022-03-05 DIAGNOSIS — F0390 Unspecified dementia without behavioral disturbance: Secondary | ICD-10-CM | POA: Diagnosis not present

## 2022-03-12 DIAGNOSIS — M170 Bilateral primary osteoarthritis of knee: Secondary | ICD-10-CM | POA: Diagnosis not present

## 2022-03-12 DIAGNOSIS — M19041 Primary osteoarthritis, right hand: Secondary | ICD-10-CM | POA: Diagnosis not present

## 2022-03-12 DIAGNOSIS — F0390 Unspecified dementia without behavioral disturbance: Secondary | ICD-10-CM | POA: Diagnosis not present

## 2022-03-12 DIAGNOSIS — M160 Bilateral primary osteoarthritis of hip: Secondary | ICD-10-CM | POA: Diagnosis not present

## 2022-03-12 DIAGNOSIS — I10 Essential (primary) hypertension: Secondary | ICD-10-CM | POA: Diagnosis not present

## 2022-03-12 DIAGNOSIS — M19042 Primary osteoarthritis, left hand: Secondary | ICD-10-CM | POA: Diagnosis not present

## 2022-03-18 ENCOUNTER — Telehealth: Payer: Self-pay | Admitting: Cardiology

## 2022-03-18 NOTE — Telephone Encounter (Signed)
Sandra Cruz with Tom from [facility] who reported that the patient declined occupational therapy today. Pt had an appt with a doctors office that took longer than expected and tired her out today.

## 2022-03-18 NOTE — Telephone Encounter (Signed)
Sandra Cruz with [facility] is calling stating occupational therapy was declined today. Pt had an appt with a doctors office that took longer than expected and tired her out today.

## 2022-03-25 DIAGNOSIS — B379 Candidiasis, unspecified: Secondary | ICD-10-CM | POA: Diagnosis not present

## 2022-03-25 DIAGNOSIS — L309 Dermatitis, unspecified: Secondary | ICD-10-CM | POA: Diagnosis not present

## 2022-04-02 DIAGNOSIS — M170 Bilateral primary osteoarthritis of knee: Secondary | ICD-10-CM | POA: Diagnosis not present

## 2022-04-02 DIAGNOSIS — M25561 Pain in right knee: Secondary | ICD-10-CM | POA: Diagnosis not present

## 2022-04-08 ENCOUNTER — Encounter: Payer: Self-pay | Admitting: Cardiology

## 2022-04-08 ENCOUNTER — Telehealth: Payer: Self-pay | Admitting: Family Medicine

## 2022-04-08 NOTE — Telephone Encounter (Signed)
Sandra Cruz, pt has not been seen since 01/04/2020. She will need to be seen first before we can make any adjustments to medications. Please call back and schedule appt. Other option is for them to contact PCP (looks like they have been refilling meds for her?) Thank you

## 2022-04-08 NOTE — Telephone Encounter (Signed)
Called [place] back. Informed her of nurse Terrence Dupont message. Asked would she like to schedule appointment. Stated she will reach out to PCP and call our office later to schedule appointment.

## 2022-04-08 NOTE — Telephone Encounter (Signed)
Pt daughter is Chief Technology Officer. Stated pt is having a increase yell-out spells. Juliann Pulse is calling and requesting medication memantine (NAMENDA) 10 MG tablet be increased. Juliann Pulse is requesting a call back from nurse.

## 2022-04-16 DIAGNOSIS — M170 Bilateral primary osteoarthritis of knee: Secondary | ICD-10-CM | POA: Diagnosis not present

## 2022-04-16 DIAGNOSIS — M25562 Pain in left knee: Secondary | ICD-10-CM | POA: Diagnosis not present

## 2022-04-22 ENCOUNTER — Ambulatory Visit: Payer: Medicare Other | Admitting: Neurology

## 2022-04-29 ENCOUNTER — Encounter: Payer: Self-pay | Admitting: Neurology

## 2022-04-29 ENCOUNTER — Ambulatory Visit (INDEPENDENT_AMBULATORY_CARE_PROVIDER_SITE_OTHER): Payer: Medicare Other | Admitting: Neurology

## 2022-04-29 ENCOUNTER — Telehealth: Payer: Self-pay | Admitting: Neurology

## 2022-04-29 VITALS — BP 138/95 | HR 69 | Ht 66.0 in

## 2022-04-29 DIAGNOSIS — G301 Alzheimer's disease with late onset: Secondary | ICD-10-CM

## 2022-04-29 DIAGNOSIS — F02C11 Dementia in other diseases classified elsewhere, severe, with agitation: Secondary | ICD-10-CM | POA: Diagnosis not present

## 2022-04-29 MED ORDER — DONEPEZIL HCL 10 MG PO TABS: 10.0000 mg | ORAL_TABLET | Freq: Every evening | ORAL | 3 refills | Status: AC

## 2022-04-29 MED ORDER — CITALOPRAM HYDROBROMIDE 10 MG PO TABS: 10.0000 mg | ORAL_TABLET | Freq: Every day | ORAL | 3 refills | Status: DC

## 2022-04-29 MED ORDER — MEMANTINE HCL 10 MG PO TABS: 10.0000 mg | ORAL_TABLET | Freq: Two times a day (BID) | ORAL | 3 refills | Status: AC

## 2022-04-29 NOTE — Patient Instructions (Addendum)
"[facility]" [facility] at [place] [facility] - [name] for irritability Continue namenda and donepezil Can reorder home PT let me know when you want to try that and which company you do not want [facility] - Dr. [name] contact him and let him know about the Celexa  Medications for diabetes and/or weight loss: Semaglutide (ozempic and wegovy) and Tirzepatide(mounjaro and Zepdone)  Lecanemab Injection What is this medication? LECANEMAB (lek AN e mab) treats Alzheimer disease. It works by decreasing the buildup of amyloid, a protein that may cause Alzheimer disease. This may slow down the worsening of symptoms. It is a monoclonal antibody. This medicine may be used for other purposes; ask your health care provider or pharmacist if you have questions. COMMON BRAND NAME(S): LEQEMBI What should I tell my care team before I take this medication? They need to know if you have any of these conditions: An unusual or allergic reaction to lecanemab, other medications, foods, dyes, or preservatives Take medications that treat or prevent blood clots Pregnant or trying to get pregnant Breast-feeding How should I use this medication? This medication is injected into a vein. It is given by your care team in a hospital or clinic setting. A special MedGuide will be given to you before each treatment. Be sure to read this information carefully each time. Talk to your care team about the use of this medication in children. Special care may be needed. Overdosage: If you think you have taken too much of this medicine contact a poison control center or emergency room at once. NOTE: This medicine is only for you. Do not share this medicine with others. What if I miss a dose? Keep appointments for follow-up doses. It is important not to miss your dose. Call your care team if you are unable to keep an appointment. What may interact with this  medication? Interactions have not been studied. This list may not describe all possible interactions. Give your health care provider a list of all the medicines, herbs, non-prescription drugs, or dietary supplements you use. Also tell them if you smoke, drink alcohol, or use illegal drugs. Some items may interact with your medicine. What should I watch for while using this medication? Visit your care team for regular checks on your progress. Tell your care team if your symptoms do not start to get better or if they get worse. What side effects may I notice from receiving this medication? Side effects that you should report to your care team as soon as possible: Allergic reactions or angioedema--skin rash, itching or hives, swelling of the face, eyes, lips, tongue, arms, or legs, trouble swallowing or breathing Headache, worsening confusion, dizziness, change in vision, nausea, seizures Infusion reactions--chest pain, shortness of breath or trouble breathing, feeling faint or lightheaded Side effects that usually do not require medical attention (report these to your care team if they continue or are bothersome): Cough Diarrhea Headache This list may not describe all possible side effects. Call your doctor for medical advice about side effects. You may report side effects to FDA at 1-800-FDA-1088. Where should I keep my medication? This medication is given in a hospital or clinic. It will not be stored at home. NOTE: This sheet is a summary. It may not cover all possible information. If you have questions about this medicine, talk to your doctor, pharmacist, or health care provider.  2023 Elsevier/Gold Standard (2021-06-03 00:00:00)  Citalopram Solution What  is this medication? CITALOPRAM (sye TAL oh pram) treats depression. It increases the amount of serotonin in the brain, a hormone that helps regulate mood. It belongs to a group of medications called SSRIs. This medicine may be used for  other purposes; ask your health care provider or pharmacist if you have questions. COMMON BRAND NAME(S): Celexa What should I tell my care team before I take this medication? They need to know if you have any of these conditions: Bipolar disorder or a family history of bipolar disorder Bleeding disorders Glaucoma Heart disease History of irregular heartbeat Kidney disease Liver disease Low levels of magnesium or potassium in the blood Receiving electroconvulsive therapy Seizures Suicidal thoughts, plans, or attempt; a previous suicide attempt by you or a family member Take medications that treat or prevent blood clots Thyroid disease An unusual or allergic reaction to citalopram, escitalopram, other medications, foods, dyes, or preservatives Pregnant or trying to become pregnant Breast-feeding How should I use this medication? Take this medication by mouth. Follow the directions on the prescription label. Use a specially marked spoon or container to measure your medication. Ask your pharmacist if you do not have one. Household spoons are not accurate. This medication can be taken with or without food. Take your medication at regular intervals. Do not take your medication more often than directed. Do not stop taking this medication suddenly except upon the advice of your care team. Stopping this medication too quickly may cause serious side effects or your condition may worsen. A special MedGuide will be given to you by the pharmacist with each prescription and refill. Be sure to read this information carefully each time. Talk to your care team about the use of this medication in children. Special care may be needed. Patients over 50 years old may have a stronger reaction and need a smaller dose. Overdosage: If you think you have taken too much of this medicine contact a poison control center or emergency room at once. NOTE: This medicine is only for you. Do not share this medicine with  others. What if I miss a dose? If you miss a dose, take it as soon as you can. If it is almost time for your next dose, take only that dose. Do not take double or extra doses. What may interact with this medication? Do not take this medication with any of the following: Certain medications for fungal infections like fluconazole, itraconazole, ketoconazole, posaconazole, voriconazole Cisapride Dronedarone Escitalopram Linezolid MAOIs like Carbex, Eldepryl, Marplan, Nardil, and Parnate Methylene blue (injected into a vein) Pimozide Thioridazine This medication may also interact with the following: Alcohol Amphetamines Aspirin and aspirin-like medications Carbamazepine Certain medications for depression, anxiety, or psychotic disturbances Certain medications for infections like chloroquine, clarithromycin, erythromycin, furazolidone, isoniazid, pentamidine Certain medications for migraine headaches like almotriptan, eletriptan, frovatriptan, naratriptan, rizatriptan, sumatriptan, zolmitriptan Certain medications for sleep Certain medications that treat or prevent blood clots like dalteparin, enoxaparin, warfarin Cimetidine Diuretics Dofetilide Fentanyl Lithium Methadone Metoprolol NSAIDs, medications for pain and inflammation, like ibuprofen or naproxen Omeprazole Other medications that prolong the QT interval (cause an abnormal heart rhythm) Procarbazine Rasagiline Supplements like St. John's wort, kava kava, valerian Tramadol Tryptophan Ziprasidone This list may not describe all possible interactions. Give your health care provider a list of all the medicines, herbs, non-prescription drugs, or dietary supplements you use. Also tell them if you smoke, drink alcohol, or use illegal drugs. Some items may interact with your medicine. What should I watch for while using this medication?  Tell your care team if your symptoms do not get better or if they get worse. Visit your care  team for regular checks on your progress. Because it may take several weeks to see the full effects of this medication, it is important to continue your treatment as prescribed. Watch for new or worsening thoughts of suicide or depression. This includes sudden changes in mood, behavior, or thoughts. These changes can happen at any time but are more common in the beginning of treatment or after a change in dose. Call your care team right away if you experience these thoughts or worsening depression. Manic episodes may happen in patients with bipolar disorder who take this medication. Watch for changes in feelings or behaviors such as feeling anxious, nervous, agitated, panicky, irritable, hostile, aggressive, impulsive, severely restless, overly excited and hyperactive, or trouble sleeping. These symptoms can happen at anytime but are more common in the beginning of treatment or after a change in dose. Call you care team right away if you notice any of these symptoms. You may get drowsy or dizzy. Do not drive, use machinery, or do anything that needs mental alertness until you know how this medication affects you. Do not stand or sit up quickly, especially if you are an older patient. This reduces the risk of dizzy or fainting spells. Alcohol may interfere with the effect of this medication. Avoid alcoholic drinks. Your mouth may get dry. Chewing sugarless gum or sucking hard candy, and drinking plenty of water may help. Contact your care team if the problem does not go away or is severe. What side effects may I notice from receiving this medication? Side effects that you should report to your care team as soon as possible: Allergic reactions--skin rash, itching, hives, swelling of the face, lips, tongue, or throat Bleeding--bloody or black, tar-like stools, red or dark brown urine, vomiting blood or brown material that looks like coffee grounds, small, red or purple spots on skin, unusual bleeding or  bruising Heart rhythm changes--fast or irregular heartbeat, dizziness, feeling faint or lightheaded, chest pain, trouble breathing Low sodium level--muscle weakness, fatigue, dizziness, headache, confusion Serotonin syndrome--irritability, confusion, fast or irregular heartbeat, muscle stiffness, twitching muscles, sweating, high fever, seizure, chills, vomiting, diarrhea Sudden eye pain or change in vision such as blurry vision, seeing halos around lights, vision loss Thoughts of suicide or self-harm, worsening mood, feelings of depression Side effects that usually do not require medical attention (report to your care team if they continue or are bothersome): Change in sex drive or performance Diarrhea Dry mouth Excessive sweating Nausea Tremors or shaking Upset stomach This list may not describe all possible side effects. Call your doctor for medical advice about side effects. You may report side effects to FDA at 1-800-FDA-1088. Where should I keep my medication? Keep out of reach of children and pets. Store at room temperature between 15 and 30 degrees C (59 and 86 degrees F). Throw away any unused medication after the expiration date. NOTE: This sheet is a summary. It may not cover all possible information. If you have questions about this medicine, talk to your doctor, pharmacist, or health care provider.  2023 Elsevier/Gold Standard (2020-05-06 00:00:00)

## 2022-04-29 NOTE — Telephone Encounter (Signed)
Can you get latest bloodwork for patient from [facility] please? thanks

## 2022-04-29 NOTE — Progress Notes (Signed)
PATIENT: Sandra Cruz DOB: May 28, 1929  REASON FOR VISIT: follow up HISTORY FROM: patient  CC: Dementia   HISTORY OF PRESENT ILLNESS:  04/29/2022: patient is living at home with daughter and husband. She is getting agitated more often. We discussed behavioral aymptoms in dementia, gave her info from [facility] on non-pharmaceutical and pharmacautical treatments, discussed options.  Discussed the following:   Severe late-stage dementia with agitation "XX Brain" Lattie Haw Mosconi Healthy Brain Study at [facility] for daughter MIND diet from [place] medication - [place], she is not a candidate, discussed with daughter Start Celexa for irritability - qtc 347 normal and looked at her bun/creat on daughter's phone from [employer], normal Continue namenda and donepezil however unlikely clinically doing any good at this time (MMSE 3/30) Can reorder home PT let me know when you want to try that and which company you do not want, she has weakness in the legs and PT and weight loss may help, has knee pain  Ask eagle for labs : requested Will forward to Dr. [name] and let him know we started celexa for agitation  Today 01/04/2020:   Sandra Cruz is a 88 y.o. female here today for follow up for dementia. She continues Aricept and Namenda. Yvone Neu (husband) and Juliann Pulse (daughter) are here with her today and aid in history. She continues to live in a retirement community. Overall, memory is stable. No major changes. She started PT in March. She feels that this is helping. She has been more active. She is eating and drinking normally, although, she does seem to enjoy comfort foods more than meats. She is able to perform most ADL's with minimal assistance. Yvone Neu will dose medications for her. Meals are provided by the retirement community.   BUN and creat nml on recent labs. Creat 0.85.    HISTORY: (copied from Dr Cathren Laine note on 06/29/2019)  Interval history June 29, 2019: Patient appears to be  doing well, she is on the call with her husband and daughter, husband is concerned because in the middle of the night when she wakes up it appears as though she is more confused and does not recognize him.  But with gentle reassurance she does eventually come to realize she is at home with her husband.  I did discuss this at length with family, as long as he can reassure her that I think that this is fine.  We did discuss behavioral redirection.  Patient today says that she is doing fine.  Daughters are there to help.  They also have daily nursing care that helps patient with bathing and other ADLs.  At this point she is on Aricept and Namenda, we did discuss that currently there is no other treatments.  She can follow-up with [place] in 6 months or a year but she can also be return to primary care.   HPI:  Sandra Cruz is a 88 y.o. female here as requested by Orpah Melter, MD for dementia.  Past medical history of hypertension, dementia, osteoarthritis of the knee, macular degeneration.  Patient is already on Aricept and Namenda.They live at [facility]. She is happy. They have been married for 60 years. We discussed dementia and medications. Husband is a Theme park manager. Daughter and husband are here. Daughter has moved family close to her to help them. Husband expressed his frustration and difficulties being a caretaker, we discussed caretaker burnout and I provided community resources for them and advised to get husband more help or  maybe go from independent living to assisted care for them. Daughter involved in conversation and appears very involved and understands concerns. Will request prior neurology notes.    Reviewed notes, labs and imaging from outside physicians, which showed:   I reviewed notes from Dr. Bjorn Loser.  She recently moved from [facility].  She is here to establish care.  Neurology has diagnosed her with dementia.  She is taking Aricept and family has seen improvement in her memory  issues since Namenda was added.  She will continue Aricept and Namenda. She is also on vitamin B12.  She is also on mirtazapine.  She has had falls in the past.  She has had a hip fracture.  She also has had anxiety and depression.  She has become more forgetful and anxious.   I reviewed labs from July 07, 2018 which included BUN of 11 and creatinine of 0.64, alk phos was slightly elevated, CO2 slightly elevated otherwise quite unremarkable.      REVIEW OF SYSTEMS: Out of a complete 14 system review of symptoms, the patient complains only of the following symptoms, weakness, memory loss, right leg pain and all other reviewed systems are negative.   ALLERGIES: Allergies  Allergen Reactions   Codeine Shortness Of Breath   Penicillins Swelling and Rash    Did it involve swelling of the face/tongue/throat, SOB, or low BP? No Did it involve sudden or severe rash/hives, skin peeling, or any reaction on the inside of your mouth or nose? No Did you need to seek medical attention at a hospital or doctor's office? No When did it last happen?      childhood If all above answers are "NO", may proceed with cephalosporin use.   Latex Other (See Comments)    blisters    HOME MEDICATIONS: Outpatient Medications Prior to Visit  Medication Sig Dispense Refill   amLODipine (NORVASC) 2.5 MG tablet Take 2.5 mg by mouth 2 (two) times a day.     carvedilol (COREG) 3.125 MG tablet Take 3.125 mg by mouth 2 (two) times a day.     clotrimazole-betamethasone (LOTRISONE) cream Apply 1 application  topically 2 (two) times daily. As needed     melatonin 3 MG TABS tablet Take 1.5 mg by mouth at bedtime.     Multiple Vitamin (MULTIVITAMIN PO) 1 tablet at noon. Brand: Life extension-High Potency Multivitamin and Mineral Supplement     Omega-3 Fatty Acids (FISH OIL) 500 MG CAPS Take 1 each by mouth daily at 12 noon.     OVER THE COUNTER MEDICATION Calcium     Probiotic Product (PROBIOTIC PO) Take 1 tablet by  mouth daily at 12 noon. 15 strains/25 billion microorganisms     senna (SENOKOT) 8.6 MG TABS tablet Take 3 tablets by mouth as needed for mild constipation.     sodium chloride 1 g tablet Take 1 g by mouth daily.     sulfamethoxazole-trimethoprim (BACTRIM DS) 800-160 MG tablet Take 1 tablet by mouth 2 (two) times daily.     donepezil (ARICEPT) 10 MG tablet Take 1 tablet (10 mg total) by mouth every evening. 90 tablet 3   memantine (NAMENDA) 10 MG tablet Take 1 tablet (10 mg total) by mouth 2 (two) times daily. 180 tablet 3   naproxen sodium (ALEVE) 220 MG tablet Take 440 mg by mouth daily as needed (pain).     Cranberry-Vitamin C (AZO CRANBERRY URINARY TRACT PO) Take 2 tablets by mouth daily.     docusate  sodium (COLACE) 100 MG capsule Take 100 mg by mouth as needed for mild constipation.     fluconazole (DIFLUCAN) 150 MG tablet Take 150 mg by mouth as needed. (Patient not taking: Reported on 09/08/2021)     Ginger, Zingiber officinalis, (GINGER ROOT) 550 MG CAPS Take 1 each by mouth daily at 12 noon.     Ginkgo Biloba (GNP GINGKO BILOBA EXTRACT PO) Take 120 mg by mouth daily at 12 noon. (Patient not taking: Reported on 09/08/2021)     Melatonin 3 MG TABS Take 4.5 mg by mouth at bedtime.     traMADol (ULTRAM) 50 MG tablet Take 1 tablet (50 mg total) by mouth every 6 (six) hours as needed. (Patient not taking: Reported on 09/08/2021) 15 tablet 0   No facility-administered medications prior to visit.    PAST MEDICAL HISTORY: Past Medical History:  Diagnosis Date   Breast cancer ([place])    Reportedly admission   Dementia ([place])    Hip fracture requiring operative repair, right, sequela 04/2018   Likely related to fall- s/p ORIF-right femur fracture   Hypertension    Macular degeneration    Osteoarthritis (arthritis due to wear and tear of joints)    Hips and knees as well as hands   Osteoporosis    Pelvic fracture ([place]) 11/14/2018   and coccyx; late the fall.  Medical management    PAST  SURGICAL HISTORY: Past Surgical History:  Procedure Laterality Date   BREAST LUMPECTOMY     malignant   cyst removal     from lower back, rods placed   LEFT HEART CATH AND CORONARY ANGIOGRAPHY  03/2017   [facility]): EF 55%; large LAD (diffuse moderate disease) with 3 diagonal branches.  D2 and D3 are small; #1 large caliber LCx with 3 OM branches (OM1 and OM 2 are small, OM 3 medium caliber main branch with minimal disease.  Large dominant RCA with moderate proximal disease, mid 30 to 40%.  Medium sized PDA with minimal disease; FFR 50% mLAD - 0.96* NOT SIGNIFICANT   NM MYOVIEW LTD  06/2014   [facility]: No evidence of ischemia or infarction.   TOTAL HIP ARTHROPLASTY     broken femur, R side, repaired   TRANSTHORACIC ECHOCARDIOGRAM  05/10/2018    LVEF 60 to 65%.  GR 1 DD.  Normal RV.  Mild RA and mild to moderate LA dilation.  Sclerotic aortic valve with no stenosis.    FAMILY HISTORY: Family History  Problem Relation Age of Onset   Stroke Mother    Cancer Father    Cancer Sister    Cancer Brother    Cancer Other        2 sisters and several brothers    SOCIAL HISTORY: Social History   Socioeconomic History   Marital status: Married    Spouse name: Not on file   Number of children: 3   Years of education: Not on file   Highest education level: Master's degree (e.g., MA, MS, MEng, MEd, MSW, MBA)  Occupational History   Not on file  Tobacco Use   Smoking status: Never   Smokeless tobacco: Never  Vaping Use   Vaping Use: Never used  Substance and Sexual Activity   Alcohol use: Never   Drug use: Never   Sexual activity: Not on file  Other Topics Concern   Not on file  Social History Narrative   Recently moved from [place], [place] to [place] area to be  close to her daughter and son-in-law.   She currently lives with her husband (a retired Theme park manager).  Husband notes caregiver burnout issues.      Update 04/29/2022   Patient and husband live with their  daughter and son-in-law   Social Determinants of Health   Financial Resource Strain: Not on file  Food Insecurity: Not on file  Transportation Needs: Not on file  Physical Activity: Not on file  Stress: Not on file  Social Connections: Not on file  Intimate Partner Violence: Not on file       04/29/2022    2:19 PM  MMSE - Mini Mental State Exam  Orientation to time 0  Orientation to Place 0  Registration 3  Attention/Calculation-comments unable to test, made patient agitated  Recall 0  Language- name 2 objects 0  Language- repeat 0  Language- follow 3 step command 3  Language- read & follow direction 0  Write a sentence-comments unable to test, patient unable to write  Copy design-comments unable to test, pt unable to write     PHYSICAL EXAM  Vitals:   04/29/22 1400  BP: (!) 138/95  Pulse: 69  Height: _0  (1.676 m)    Body mass index is 25.82 kg/m.  Exam: NAD, pleasant                  Speech:    Speech is normal; fluent  Cognition:        04/29/2022    2:19 PM  MMSE - Mini Mental State Exam  Orientation to time 0  Orientation to Place 0  Registration 3  Attention/Calculation-comments unable to test, made patient agitated  Recall 0  Language- name 2 objects 0  Language- repeat 0  Language- follow 3 step command 3  Language- read & follow direction 0  Write a sentence-comments unable to test, patient unable to write  Copy design-comments unable to test, pt unable to write      Cranial Nerves:    The pupils are equal, round, and reactive to light.Trigeminal sensation is intact and the muscles of mastication are normal. The face is symmetric. The palate elevates in the midline. Hearing intact. Voice is normal. Shoulder shrug is normal. The tongue has normal motion without fasciculations.   Coordination:  No dysmetria  Motor Observation:    No asymmetry, no atrophy, and no involuntary movements noted. Tone:    Normal muscle tone.      Strength:    Strength is difficult to assess due to severe dementia, upper extremities anti gravity without drift, can lift up legs but appear weak     Sensation: intact to LT    DIAGNOSTIC DATA (LABS, IMAGING, TESTING) - I reviewed patient records, labs, notes, testing and imaging myself where available.     04/29/2022    2:19 PM  MMSE - Mini Mental State Exam  Orientation to time 0  Orientation to Place 0  Registration 3  Attention/Calculation-comments unable to test, made patient agitated  Recall 0  Language- name 2 objects 0  Language- repeat 0  Language- follow 3 step command 3  Language- read & follow direction 0  Write a sentence-comments unable to test, patient unable to write  Copy design-comments unable to test, pt unable to write     Lab Results  Component Value Date   WBC 12.8 (H) 11/14/2018   HGB 13.0 11/14/2018   HCT 41.4 11/14/2018   MCV 97.2 11/14/2018   PLT 174 11/14/2018  Component Value Date/Time   NA 140 11/14/2018 0555   K 3.7 11/14/2018 0555   CL 105 11/14/2018 0555   CO2 26 11/14/2018 0555   GLUCOSE 99 11/14/2018 0555   BUN 19 11/14/2018 0555   CREATININE 0.73 11/14/2018 0555   CALCIUM 9.0 11/14/2018 0555   GFRNONAA >60 11/14/2018 0555   GFRAA >60 11/14/2018 0555   No results found for: "CHOL", "HDL", "LDLCALC", "LDLDIRECT", "TRIG", "CHOLHDL" No results found for: "HGBA1C" No results found for: "VITAMINB12" No results found for: "TSH"     ASSESSMENT AND PLAN 88 y.o. year old female  has a past medical history of Breast cancer ([place]), Dementia ([place]), Hip fracture requiring operative repair, right, sequela (04/2018), Hypertension, Macular degeneration, Osteoarthritis (arthritis due to wear and tear of joints), Osteoporosis, and Pelvic fracture ([place]) (11/14/2018). here with Dementia likely Alzheimer's. Daughter has 2 ApoE4 copies.daughter provides all information. Here with husband of 75 years as well.    ICD-10-CM   1. Severe late  onset Alzheimer's dementia with agitation ([place])  G30.1    F02.C11      Discussed the following:   Severe late-stage dementia with agitation "XX Brain" Lattie Haw Mosconi Healthy Brain Study at [facility] for daughter MIND diet from Oral medication - [place], she is not a candidate, discussed with daughter Start Celexa for irritability - qtc 347 normal and looked at her bun/creat on daughter's phone from [employer], normal Continue namenda and donepezil however unlikely clinically doing any good at this time (MMSE 3/30) Can reorder home PT let me know when you want to try that and which company you do not want, she has weakness in the legs and PT and weight loss may help, has knee pain  Ask eagle for labs : requested Will forward to Dr. [name] and let him know we started celexa for agitation    Meds ordered this encounter  Medications   citalopram (CELEXA) 10 MG tablet    Sig: Take 1 tablet (10 mg total) by mouth daily.    Dispense:  90 tablet    Refill:  3   donepezil (ARICEPT) 10 MG tablet    Sig: Take 1 tablet (10 mg total) by mouth every evening.    Dispense:  90 tablet    Refill:  3   memantine (NAMENDA) 10 MG tablet    Sig: Take 1 tablet (10 mg total) by mouth 2 (two) times daily.    Dispense:  180 tablet    Refill:  3      Sarina Ill, MD [facility] [address], [place],  [address] [phone]  I spent over 50  minutes of face-to-face and non-face-to-face time with patient on the  1. Severe late onset Alzheimer's dementia with agitation ([place])    diagnosis.  This included previsit chart review, lab review, study review, order entry, electronic health record documentation, patient education on the different diagnostic and therapeutic options, counseling and coordination of care, risks and benefits of management, compliance, or risk factor reduction

## 2022-05-04 ENCOUNTER — Encounter: Payer: Self-pay | Admitting: Neurology

## 2022-05-04 ENCOUNTER — Telehealth: Payer: Self-pay | Admitting: Neurology

## 2022-05-04 NOTE — Telephone Encounter (Signed)
error 

## 2022-05-19 ENCOUNTER — Telehealth: Payer: Self-pay | Admitting: Neurology

## 2022-05-19 NOTE — Telephone Encounter (Signed)
Pt's daughter states because  memantine (NAMENDA) 10 MG tablet, (1 '10mg'$  in A.M. and 1 '10mg'$  in P.M.)is prescribed by Dr Jaynee Eagles, [facility] is asking that Dr Jaynee Eagles contacts them because a PA will be needed. Pt's daughter has provided [phone] a line for pharmacist/physicians and she provided [phone] for prescription inquiry line  Pt info: Member ID# [id number] Plan#[phone] PCN#[id number] RX Group#[id number]

## 2022-05-20 NOTE — Telephone Encounter (Signed)
I have submitted a PA on Cover My Meds. Key: [id number] Outcome: Available without authorization.

## 2022-05-27 NOTE — Telephone Encounter (Signed)
Called pharmacy. Memantine is ready to be filled today. They already have refills on file.

## 2022-08-19 DIAGNOSIS — L309 Dermatitis, unspecified: Secondary | ICD-10-CM | POA: Diagnosis not present

## 2022-08-19 DIAGNOSIS — F0390 Unspecified dementia without behavioral disturbance: Secondary | ICD-10-CM | POA: Diagnosis not present

## 2022-09-04 DIAGNOSIS — Z9181 History of falling: Secondary | ICD-10-CM | POA: Diagnosis not present

## 2022-09-04 DIAGNOSIS — M170 Bilateral primary osteoarthritis of knee: Secondary | ICD-10-CM | POA: Diagnosis not present

## 2022-09-04 DIAGNOSIS — M160 Bilateral primary osteoarthritis of hip: Secondary | ICD-10-CM | POA: Diagnosis not present

## 2022-09-04 DIAGNOSIS — F0390 Unspecified dementia without behavioral disturbance: Secondary | ICD-10-CM | POA: Diagnosis not present

## 2022-09-07 ENCOUNTER — Telehealth: Payer: Self-pay | Admitting: Neurology

## 2022-09-07 NOTE — Telephone Encounter (Signed)
Sent mychart msg informing pt of appointment change due to provider template change

## 2022-09-24 DIAGNOSIS — L309 Dermatitis, unspecified: Secondary | ICD-10-CM | POA: Diagnosis not present

## 2022-09-24 DIAGNOSIS — L304 Erythema intertrigo: Secondary | ICD-10-CM | POA: Diagnosis not present

## 2022-09-24 DIAGNOSIS — B351 Tinea unguium: Secondary | ICD-10-CM | POA: Diagnosis not present

## 2022-09-24 DIAGNOSIS — B353 Tinea pedis: Secondary | ICD-10-CM | POA: Diagnosis not present

## 2022-09-28 ENCOUNTER — Ambulatory Visit: Payer: Medicare PPO | Attending: Cardiology | Admitting: Cardiology

## 2022-09-28 ENCOUNTER — Encounter: Payer: Self-pay | Admitting: Cardiology

## 2022-09-28 VITALS — HR 59 | Ht 66.0 in | Wt 160.0 lb

## 2022-09-28 DIAGNOSIS — E782 Mixed hyperlipidemia: Secondary | ICD-10-CM | POA: Diagnosis not present

## 2022-09-28 DIAGNOSIS — I2510 Atherosclerotic heart disease of native coronary artery without angina pectoris: Secondary | ICD-10-CM

## 2022-09-28 DIAGNOSIS — Z7189 Other specified counseling: Secondary | ICD-10-CM

## 2022-09-28 DIAGNOSIS — I10 Essential (primary) hypertension: Secondary | ICD-10-CM

## 2022-09-28 NOTE — Progress Notes (Signed)
Primary Care Provider: Farris Has, MD [facility] Cardiologist: Bryan Lemma, MD Electrophysiologist: None  Clinic Note: Chief Complaint  Patient presents with   Follow-up    Annual.  Doing okay; more pronounced dementia    ===================================  ASSESSMENT/PLAN   Problem List Items Addressed This Visit       Cardiology Problems   Hyperlipidemia, mixed (Chronic)    Joint decision-making between family members and myself, for quality of life we will avoid further medications for lipid management.      Essential hypertension (Chronic)    Unable to assess BP today.  They are able to check at home, usually pretty well-controlled.  On low-dose amlodipine and carvedilol.  We discussed that if she were to be feeling poorly, not eating well, I recommended that they hold amlodipine until she starts feeling and eating better.      Coronary artery disease, non-occlusive - Primary (Chronic)    No active anginal symptoms.  Continuing low-dose carvedilol and amlodipine. On fish oil but not on statin because of memory issues. Not on aspirin for fear of falls.      Relevant Orders   EKG 12-Lead (Completed)     Other   Counseling regarding advance care planning and goals of care    Per previous discussions with GI decision making, no interest in invasive procedures and therefore no need for doing further testing such as stress test and echoes.  We are also minimizing her therapy only treating.her blood pressure with low-dose medicines.  Did not address CODE STATUS       ===================================  HPI:    Sandra Cruz is a 89 y.o. female with a PMH notable for HTN, HLD and Nonocclusive CAD who presents today for annual follow-up along with her husband and daughter Olegario Messier).Jaquita Folds was last seen in April 2023-doing well.  Active chest pain.  Stable.  Okay to do lab today.)  Intolerant syncope-likely deconditioning.  Definitely  showing signs of progressive dementia..  No major complaints.  Somewhat nontalkative.  Chest pain episodes treated with M&Ms.  Recent Hospitalizations: None  Reviewed  CV studies:    The following studies were reviewed today: (if available, images/films reviewed: From Epic Chart or Care Everywhere) None:  Interval History:   Sandra Cruz returns here today now in a wheelchair.  She has shown progression of her dementia.  She is small for long) short little phrases with but not full sentences.  She is now provide full-time in a wheelchair and sleeps quite a bit.  She has had lots of pain in her knees and discussed injection.  Not doing any real activity.  No complaints of chest pain or pressure with what ever activity she does.  No exertional dyspnea.  No dyspnea at rest.  No PND, orthopnea-the patient sleeps very well.  Trivial edema.  Because she does not really feel there is no sensation of dizziness except occasionally she stands up too quickly.  CV Review of Symptoms (Summary):  no chest pain or dyspnea on exertion positive for - positional dizziness, and deconditioning related to lack of energy. negative for - edema, irregular heartbeat, orthopnea, palpitations, paroxysmal nocturnal dyspnea, rapid heart rate, shortness of breath, or syncope or near syncope.  TIA/amaurosis fugax, claudication.  REVIEWED OF SYSTEMS   Review of Systems  Constitutional:  Positive for malaise/fatigue (More related to deconditioning, and lack of activity.  Not sure if it is anhedonia anymore.).  HENT:  Negative for  nosebleeds.   Gastrointestinal:  Negative for blood in stool and melena.  Genitourinary:  Positive for frequency. Negative for dysuria and hematuria.  Musculoskeletal:  Positive for joint pain (Recent knee injection). Negative for falls.  Neurological:  Positive for dizziness and weakness (Generalized.  Most of the legs).  Psychiatric/Behavioral:  Positive for memory loss. Negative for  depression. The patient is nervous/anxious (Has frequent panic attacks). The patient does not have insomnia.    I have reviewed and (if needed) personally updated the patient's problem list, medications, allergies, past medical and surgical history, social and family history.   PAST MEDICAL HISTORY   Past Medical History:  Diagnosis Date   Breast cancer [facility])    Reportedly admission   Dementia [facility])    Hip fracture requiring operative repair, right, sequela 04/2018   Likely related to fall- s/p ORIF-right femur fracture   Hypertension    Macular degeneration    Osteoarthritis (arthritis due to wear and tear of joints)    Hips and knees as well as hands   Osteoporosis    Pelvic fracture (HCC) 11/14/2018   and coccyx; late the fall.  Medical management    PAST SURGICAL HISTORY   Past Surgical History:  Procedure Laterality Date   BREAST LUMPECTOMY     malignant   cyst removal     from lower back, rods placed   LEFT HEART CATH AND CORONARY ANGIOGRAPHY  03/2017   [facility]): EF 55%; large LAD (diffuse moderate disease) with 3 diagonal branches.  D2 and D3 are small; #1 large caliber LCx with 3 OM branches (OM1 and OM 2 are small, OM 3 medium caliber main branch with minimal disease.  Large dominant RCA with moderate proximal disease, mid 30 to 40%.  Medium sized PDA with minimal disease; FFR 50% mLAD - 0.96* NOT SIGNIFICANT   NM MYOVIEW LTD  06/2014   [facility]: No evidence of ischemia or infarction.   TOTAL HIP ARTHROPLASTY     broken femur, R side, repaired   TRANSTHORACIC ECHOCARDIOGRAM  05/10/2018    LVEF 60 to 65%.  GR 1 DD.  Normal RV.  Mild RA and mild to moderate LA dilation.  Sclerotic aortic valve with no stenosis.    There is no immunization history on file for this patient.  MEDICATIONS/ALLERGIES   Current Meds  Medication Sig   amLODipine (NORVASC) 2.5 MG tablet Take 2.5 mg by mouth 2 (two) times a day.   carvedilol (COREG) 3.125 MG tablet  Take 3.125 mg by mouth 2 (two) times a day.   citalopram (CELEXA) 10 MG tablet Take 1 tablet (10 mg total) by mouth daily.   clotrimazole-betamethasone (LOTRISONE) cream Apply 1 application  topically 2 (two) times daily. As needed   donepezil (ARICEPT) 10 MG tablet Take 1 tablet (10 mg total) by mouth every evening.   melatonin 3 MG TABS tablet Take 1.5 mg by mouth at bedtime.   memantine (NAMENDA) 10 MG tablet Take 1 tablet (10 mg total) by mouth 2 (two) times daily.   Omega-3 Fatty Acids (FISH OIL) 500 MG CAPS Take 1 each by mouth daily at 12 noon.   senna (SENOKOT) 8.6 MG TABS tablet Take 3 tablets by mouth as needed for mild constipation.    Allergies  Allergen Reactions   Codeine Shortness Of Breath   Penicillins Swelling and Rash    Did it involve swelling of the face/tongue/throat, SOB, or low BP? No Did it involve sudden  or severe rash/hives, skin peeling, or any reaction on the inside of your mouth or nose? No Did you need to seek medical attention at a hospital or doctor's office? No When did it last happen?      childhood If all above answers are "NO", may proceed with cephalosporin use.   Latex Other (See Comments)    blisters    SOCIAL HISTORY/FAMILY HISTORY   Reviewed in Epic:  Pertinent findings:  Social History   Tobacco Use   Smoking status: Never   Smokeless tobacco: Never  Vaping Use   Vaping Use: Never used  Substance Use Topics   Alcohol use: Never   Drug use: Never   Social History   Social History Narrative   Recently moved from [place], [place] to [place] area to be close to her daughter and son-in-law.   She currently lives with her husband (a retired Education officer, environmental).  Husband notes caregiver burnout issues.      Update 04/29/2022   Patient and husband live with their daughter and son-in-law    OBJCTIVE -PE, EKG, labs   Wt Readings from Last 3 Encounters:  09/28/22 160 lb (72.6 kg)  09/08/21 160 lb (72.6 kg)  12/28/19 150 lb (68 kg)    Physical  Exam: Pulse (!) 59   Ht 5\' 6"  (1.676 m)   Wt 160 lb (72.6 kg)   SpO2 97%   BMI 25.82 kg/m  Physical Exam Constitutional:      General: She is not in acute distress.    Appearance: She is not ill-appearing or toxic-appearing.     Comments: Sitting in chair.  Wearing a jacket and blanket..  Weak and debilitated.  HENT:     Head: Normocephalic and atraumatic.  Neck:     Vascular: No carotid bruit.  Cardiovascular:     Rate and Rhythm: Normal rate and regular rhythm. Occasional Extrasystoles are present.    Chest Wall: PMI is not displaced.     Pulses: Normal pulses.     Heart sounds: S1 normal and S2 normal. Murmur heard.     Harsh crescendo-decrescendo midsystolic murmur is present with a grade of 1/6 at the upper right sternal border radiating to the neck.     No friction rub. No gallop.  Pulmonary:     Effort: Pulmonary effort is normal. No respiratory distress.     Breath sounds: Normal breath sounds. No wheezing, rhonchi or rales.  Chest:     Chest wall: No tenderness.  Musculoskeletal:        General: Swelling (Stable puffy swelling) present.     Cervical back: Normal range of motion and neck supple.  Skin:    General: Skin is warm and dry.  Neurological:     General: No focal deficit present.     Mental Status: She is alert. She is disoriented.     Motor: Weakness (Generalized) present.  Psychiatric:        Mood and Affect: Mood normal.     Comments: Very confused.  Not really sure where she is.  She does herself, husband and her daughter.  She actually had a brief panic attack during this part of the visit, and was Easily calmed by her daughter.      Adult ECG Report Not checked  Recent Labs:   05/26/2022: Cr 26, K+ 4 point TAVR: Hgb 13.7, A1c 5.5.  TC 229, TG 175, HDL 46, LDL 150.  ================================================== I spent a total of 17 minutes with the  patient spent in direct patient consultation.  Additional time spent with chart review  /  charting (studies, outside notes, etc): 14 min Total Time: 31 min  Current medicines are reviewed at length with the patient today.  (+/- concerns)  We discussed that if she were to be feeling poorly, not eating well, I recommended that they hold amlodipine until she starts feeling and eating better.  Notice: This dictation was prepared with Dragon dictation along with smart phrase technology. Any transcriptional errors that result from this process are unintentional and may not be corrected upon review.  Studies Ordered:   Orders Placed This Encounter  Procedures   EKG 12-Lead   No orders of the defined types were placed in this encounter.   Patient Instructions / Medication Changes & Studies & Tests Ordered   Patient Instructions  Medication Instructions:  No changes  *If you need a refill on your cardiac medications before your next appointment, please call your pharmacy*   Lab Work: Not  needed If you have labs (blood work) drawn today and your tests are completely normal, you will receive your results only by: MyChart Message (if you have MyChart) OR A paper copy in the mail If you have any lab test that is abnormal or we need to change your treatment, we will call you to review the results.   Testing/Procedures: Not needed   Follow-Up: At [facility], you and your health needs are our priority.  As part of our continuing mission to provide you with exceptional heart care, we have created designated Provider Care Teams.  These Care Teams include your primary Cardiologist (physician) and Advanced Practice Providers (APPs -  Physician Assistants and Nurse Practitioners) who all work together to provide you with the care you need, when you need it.     Your next appointment:   6 month(s)  The format for your next appointment:   In Person  Provider:   Bryan Lemma, MD     Marykay Lex, MD, MS Bryan Lemma, M.D., M.S. Interventional Cardiologist   [facility]  Pager # [phone] Phone # [phone] [address] [place], [place] [address]   Thank you for choosing [facility] [place]!!

## 2022-09-28 NOTE — Patient Instructions (Signed)
Medication Instructions:  No changes   *If you need a refill on your cardiac medications before your next appointment, please call your pharmacy*   Lab Work: Not needed If you have labs (blood work) drawn today and your tests are completely normal, you will receive your results only by: . MyChart Message (if you have MyChart) OR . A paper copy in the mail If you have any lab test that is abnormal or we need to change your treatment, we will call you to review the results.   Testing/Procedures: Not needed   Follow-Up: At [facility], you and your health needs are our priority.  As part of our continuing mission to provide you with exceptional heart care, we have created designated Provider Care Teams.  These Care Teams include your primary Cardiologist (physician) and Advanced Practice Providers (APPs -  Physician Assistants and Nurse Practitioners) who all work together to provide you with the care you need, when you need it.   Your next appointment:   6 month(s)  The format for your next appointment:   In Person  Provider:   David Harding, MD    

## 2022-10-04 DIAGNOSIS — Z9181 History of falling: Secondary | ICD-10-CM | POA: Diagnosis not present

## 2022-10-04 DIAGNOSIS — M160 Bilateral primary osteoarthritis of hip: Secondary | ICD-10-CM | POA: Diagnosis not present

## 2022-10-04 DIAGNOSIS — M170 Bilateral primary osteoarthritis of knee: Secondary | ICD-10-CM | POA: Diagnosis not present

## 2022-10-04 DIAGNOSIS — F0390 Unspecified dementia without behavioral disturbance: Secondary | ICD-10-CM | POA: Diagnosis not present

## 2022-10-16 DIAGNOSIS — M25561 Pain in right knee: Secondary | ICD-10-CM | POA: Diagnosis not present

## 2022-10-16 DIAGNOSIS — M25562 Pain in left knee: Secondary | ICD-10-CM | POA: Diagnosis not present

## 2022-10-16 DIAGNOSIS — M170 Bilateral primary osteoarthritis of knee: Secondary | ICD-10-CM | POA: Diagnosis not present

## 2022-10-17 ENCOUNTER — Encounter: Payer: Self-pay | Admitting: Cardiology

## 2022-10-17 NOTE — Assessment & Plan Note (Signed)
Joint decision-making between family members and myself, for quality of life we will avoid further medications for lipid management.

## 2022-10-17 NOTE — Assessment & Plan Note (Signed)
Per previous discussions with GI decision making, no interest in invasive procedures and therefore no need for doing further testing such as stress test and echoes.  We are also minimizing her therapy only treating.her blood pressure with low-dose medicines.  Did not address CODE STATUS

## 2022-10-17 NOTE — Assessment & Plan Note (Signed)
Unable to assess BP today.  They are able to check at home, usually pretty well-controlled.  On low-dose amlodipine and carvedilol.  We discussed that if she were to be feeling poorly, not eating well, I recommended that they hold amlodipine until she starts feeling and eating better.

## 2022-10-17 NOTE — Assessment & Plan Note (Signed)
No active anginal symptoms.  Continuing low-dose carvedilol and amlodipine. On fish oil but not on statin because of memory issues. Not on aspirin for fear of falls.

## 2022-10-26 ENCOUNTER — Telehealth: Payer: Medicare Other | Admitting: Neurology

## 2022-10-26 ENCOUNTER — Encounter: Payer: Self-pay | Admitting: Neurology

## 2022-10-27 ENCOUNTER — Telehealth (INDEPENDENT_AMBULATORY_CARE_PROVIDER_SITE_OTHER): Payer: Medicare PPO | Admitting: Neurology

## 2022-10-27 ENCOUNTER — Telehealth: Payer: Self-pay | Admitting: Neurology

## 2022-10-27 DIAGNOSIS — F02C11 Dementia in other diseases classified elsewhere, severe, with agitation: Secondary | ICD-10-CM

## 2022-10-27 DIAGNOSIS — G301 Alzheimer's disease with late onset: Secondary | ICD-10-CM

## 2022-10-27 NOTE — Telephone Encounter (Signed)
Sent mychart msg informing pt of follow up appt made with [facility]

## 2022-10-27 NOTE — Progress Notes (Signed)
PATIENT: Sandra Cruz DOB: 1929-06-13  REASON FOR VISIT: follow up HISTORY FROM: patient  CC: Dementia    Virtual Visit via Video Note  I connected with Jaquita Folds on 10/27/22 at  3:00 PM EDT by a video enabled telemedicine application and verified that I am speaking with the correct person using two identifiers.  Location: Patient: home Provider: office   I discussed the limitations of evaluation and management by telemedicine and the availability of in person appointments. The patient expressed understanding and agreed to proceed.   Follow Up Instructions:    I discussed the assessment and treatment plan with the patient. The patient was provided an opportunity to ask questions and all were answered. The patient agreed with the plan and demonstrated an understanding of the instructions.   The patient was advised to call back or seek an in-person evaluation if the symptoms worsen or if the condition fails to improve as anticipated.  I provided 25 minutes of non-face-to-face time during this encounter.   Anson Fret, MD  HISTORY OF PRESENT ILLNESS:  10/27/2022: Things are going well.  Skin irritation: on traimcinolone, try allergist, sleep on other side, use free and clear Sleeping: uses 1.5mg  melatonin. Was sleeping well until the itching. Weight loss: small. She is doing 20 minutes a day 5 days a week on a powered bike.  Knee injection: every 6 months Appetite: good, swallowing is fine Fun: likes sitting on the deck, being read to, walks around the block and talks to people.   Patient complains of symptoms per HPI as well as the following symptoms: dementia . Pertinent negatives and positives per HPI. All others negative   04/29/2022: patient is living at home with daughter and husband. She is getting agitated more often. We discussed behavioral aymptoms in dementia, gave her info from [email or web address] on non-pharmaceutical and pharmacautical treatments, discussed  options. She has skin irritation and goes to the dermatologist. Daughter provides most information.  Discussed the following:   Severe late-stage dementia with agitation "XX Brain" Misty Stanley Mosconi Healthy Brain Study at [facility] for daughter MIND diet from [place] medication - [place], she is not a candidate, discussed with daughter Start Celexa for irritability - qtc 347 normal and looked at her bun/creat on daughter's phone from [employer], normal Continue namenda and donepezil however unlikely clinically doing any good at this time (MMSE 3/30) Can reorder home PT let me know when you want to try that and which company you do not want, she has weakness in the legs and PT and weight loss may help, has knee pain  Ask eagle for labs : requested Will forward to Dr. Kateri Plummer and let him know we started celexa for agitation  Today 01/04/2020:   Sandra Cruz is a 89 y.o. female here today for follow up for dementia. She continues Aricept and Namenda. Rocky Link (husband) and Olegario Messier (daughter) are here with her today and aid in history. She continues to live in a retirement community. Overall, memory is stable. No major changes. She started PT in March. She feels that this is helping. She has been more active. She is eating and drinking normally, although, she does seem to enjoy comfort foods more than meats. She is able to perform most ADL's with minimal assistance. Rocky Link will dose medications for her. Meals are provided by the retirement community.   BUN and creat nml on recent labs. Creat 0.85.    HISTORY: (copied from Dr Trevor Mace note  on 06/29/2019)  Interval history June 29, 2019: Patient appears to be doing well, she is on the call with her husband and daughter, husband is concerned because in the middle of the night when she wakes up it appears as though she is more confused and does not recognize him.  But with gentle reassurance she does eventually come to realize she is at home with her  husband.  I did discuss this at length with family, as long as he can reassure her that I think that this is fine.  We did discuss behavioral redirection.  Patient today says that she is doing fine.  Daughters are there to help.  They also have daily nursing care that helps patient with bathing and other ADLs.  At this point she is on Aricept and Namenda, we did discuss that currently there is no other treatments.  She can follow-up with [place] in 6 months or a year but she can also be return to primary care.   HPI:  Sandra Cruz is a 89 y.o. female here as requested by Joycelyn Rua, MD for dementia.  Past medical history of hypertension, dementia, osteoarthritis of the knee, macular degeneration.  Patient is already on Aricept and Namenda.They live at [facility]. She is happy. They have been married for 58 years. We discussed dementia and medications. Husband is a Education officer, environmental. Daughter and husband are here. Daughter has moved family close to her to help them. Husband expressed his frustration and difficulties being a caretaker, we discussed caretaker burnout and I provided community resources for them and advised to get husband more help or maybe go from independent living to assisted care for them. Daughter involved in conversation and appears very involved and understands concerns. Will request prior neurology notes.    Reviewed notes, labs and imaging from outside physicians, which showed:   I reviewed notes from Dr. Daphane Shepherd.  She recently moved from [facility].  She is here to establish care.  Neurology has diagnosed her with dementia.  She is taking Aricept and family has seen improvement in her memory issues since Namenda was added.  She will continue Aricept and Namenda. She is also on vitamin B12.  She is also on mirtazapine.  She has had falls in the past.  She has had a hip fracture.  She also has had anxiety and depression.  She has become more forgetful and anxious.   I reviewed labs  from July 07, 2018 which included BUN of 11 and creatinine of 0.64, alk phos was slightly elevated, CO2 slightly elevated otherwise quite unremarkable.      REVIEW OF SYSTEMS: Out of a complete 14 system review of symptoms, the patient complains only of the following symptoms, weakness, memory loss, right leg pain and all other reviewed systems are negative.   ALLERGIES: Allergies  Allergen Reactions   Codeine Shortness Of Breath   Penicillins Swelling and Rash    Did it involve swelling of the face/tongue/throat, SOB, or low BP? No Did it involve sudden or severe rash/hives, skin peeling, or any reaction on the inside of your mouth or nose? No Did you need to seek medical attention at a hospital or doctor's office? No When did it last happen?      childhood If all above answers are "NO", may proceed with cephalosporin use.   Latex Other (See Comments)    blisters    HOME MEDICATIONS: Outpatient Medications Prior to Visit  Medication Sig Dispense Refill  amLODipine (NORVASC) 2.5 MG tablet Take 2.5 mg by mouth 2 (two) times a day.     carvedilol (COREG) 3.125 MG tablet Take 3.125 mg by mouth 2 (two) times a day.     citalopram (CELEXA) 10 MG tablet Take 1 tablet (10 mg total) by mouth daily. 90 tablet 3   clotrimazole-betamethasone (LOTRISONE) cream Apply 1 application  topically 2 (two) times daily. As needed     donepezil (ARICEPT) 10 MG tablet Take 1 tablet (10 mg total) by mouth every evening. 90 tablet 3   melatonin 3 MG TABS tablet Take 1.5 mg by mouth at bedtime.     memantine (NAMENDA) 10 MG tablet Take 1 tablet (10 mg total) by mouth 2 (two) times daily. 180 tablet 3   Multiple Vitamin (MULTIVITAMIN PO) 1 tablet at noon. Brand: Life extension-High Potency Multivitamin and Mineral Supplement     Omega-3 Fatty Acids (FISH OIL) 500 MG CAPS Take 1 each by mouth daily at 12 noon.     OVER THE COUNTER MEDICATION Calcium     Probiotic Product (PROBIOTIC PO) Take 1 tablet by  mouth daily at 12 noon. 15 strains/25 billion microorganisms     senna (SENOKOT) 8.6 MG TABS tablet Take 3 tablets by mouth as needed for mild constipation.     sodium chloride 1 g tablet Take 1 g by mouth daily.     No facility-administered medications prior to visit.    PAST MEDICAL HISTORY: Past Medical History:  Diagnosis Date   Breast cancer (HCC)    Reportedly admission   Dementia (HCC)    Hip fracture requiring operative repair, right, sequela 04/2018   Likely related to fall- s/p ORIF-right femur fracture   Hypertension    Macular degeneration    Osteoarthritis (arthritis due to wear and tear of joints)    Hips and knees as well as hands   Osteoporosis    Pelvic fracture (HCC) 11/14/2018   and coccyx; late the fall.  Medical management    PAST SURGICAL HISTORY: Past Surgical History:  Procedure Laterality Date   BREAST LUMPECTOMY     malignant   cyst removal     from lower back, rods placed   LEFT HEART CATH AND CORONARY ANGIOGRAPHY  03/2017   [facility]): EF 55%; large LAD (diffuse moderate disease) with 3 diagonal branches.  D2 and D3 are small; #1 large caliber LCx with 3 OM branches (OM1 and OM 2 are small, OM 3 medium caliber main branch with minimal disease.  Large dominant RCA with moderate proximal disease, mid 30 to 40%.  Medium sized PDA with minimal disease; FFR 50% mLAD - 0.96* NOT SIGNIFICANT   NM MYOVIEW LTD  06/2014   [facility]: No evidence of ischemia or infarction.   TOTAL HIP ARTHROPLASTY     broken femur, R side, repaired   TRANSTHORACIC ECHOCARDIOGRAM  05/10/2018    LVEF 60 to 65%.  GR 1 DD.  Normal RV.  Mild RA and mild to moderate LA dilation.  Sclerotic aortic valve with no stenosis.    FAMILY HISTORY: Family History  Problem Relation Age of Onset   Stroke Mother    Cancer Father    Cancer Sister    Cancer Brother    Cancer Other        2 sisters and several brothers    SOCIAL HISTORY: Social History    Socioeconomic History   Marital status: Married    Spouse name: Not  on file   Number of children: 3   Years of education: Not on file   Highest education level: Master's degree (e.g., MA, MS, MEng, MEd, MSW, MBA)  Occupational History   Not on file  Tobacco Use   Smoking status: Never   Smokeless tobacco: Never  Vaping Use   Vaping Use: Never used  Substance and Sexual Activity   Alcohol use: Never   Drug use: Never   Sexual activity: Not on file  Other Topics Concern   Not on file  Social History Narrative   Recently moved from [place], [place] to [place] area to be close to her daughter and son-in-law.   She currently lives with her husband (a retired Education officer, environmental).  Husband notes caregiver burnout issues.      Update 04/29/2022   Patient and husband live with their daughter and son-in-law   Social Determinants of Health   Financial Resource Strain: Not on file  Food Insecurity: Not on file  Transportation Needs: Not on file  Physical Activity: Not on file  Stress: Not on file  Social Connections: Not on file  Intimate Partner Violence: Not on file       04/29/2022    2:19 PM  MMSE - Mini Mental State Exam  Orientation to time 0  Orientation to Place 0  Registration 3  Attention/Calculation-comments unable to test, made patient agitated  Recall 0  Language- name 2 objects 0  Language- repeat 0  Language- follow 3 step command 3  Language- read & follow direction 0  Write a sentence-comments unable to test, patient unable to write  Copy design-comments unable to test, pt unable to write     Exam: NAD, pleasant                  Speech:    Speech is normal; paucity of speech  Cognition:    04/29/2022    2:19 PM  MMSE - Mini Mental State Exam  Orientation to time 0  Orientation to Place 0  Registration 3  Attention/Calculation-comments unable to test, made patient agitated  Recall 0  Language- name 2 objects 0  Language- repeat 0  Language- follow 3  step command 3  Language- read & follow direction 0  Write a sentence-comments unable to test, patient unable to write  Copy design-comments unable to test, pt unable to write      Cranial Nerves:    The face is symmetric. Hearing intact. Voice is normal.   Coordination:  No dysmetria noted  Motor Observation:    No asymmetry, no atrophy, and no involuntary movements noted. Tone:   appears Normal muscle tone.     Strength:    Can lift up arms    DIAGNOSTIC DATA (LABS, IMAGING, TESTING) - I reviewed patient records, labs, notes, testing and imaging myself where available.     04/29/2022    2:19 PM  MMSE - Mini Mental State Exam  Orientation to time 0  Orientation to Place 0  Registration 3  Attention/Calculation-comments unable to test, made patient agitated  Recall 0  Language- name 2 objects 0  Language- repeat 0  Language- follow 3 step command 3  Language- read & follow direction 0  Write a sentence-comments unable to test, patient unable to write  Copy design-comments unable to test, pt unable to write     Lab Results  Component Value Date   WBC 12.8 (H) 11/14/2018   HGB 13.0 11/14/2018   HCT 41.4  11/14/2018   MCV 97.2 11/14/2018   PLT 174 11/14/2018      Component Value Date/Time   NA 140 11/14/2018 0555   K 3.7 11/14/2018 0555   CL 105 11/14/2018 0555   CO2 26 11/14/2018 0555   GLUCOSE 99 11/14/2018 0555   BUN 19 11/14/2018 0555   CREATININE 0.73 11/14/2018 0555   CALCIUM 9.0 11/14/2018 0555   GFRNONAA >60 11/14/2018 0555   GFRAA >60 11/14/2018 0555   No results found for: "CHOL", "HDL", "LDLCALC", "LDLDIRECT", "TRIG", "CHOLHDL" No results found for: "HGBA1C" No results found for: "VITAMINB12" No results found for: "TSH"     ASSESSMENT AND PLAN 89 y.o. year old female  has a past medical history of Breast cancer (HCC), Dementia (HCC), Hip fracture requiring operative repair, right, sequela (04/2018), Hypertension, Macular degeneration,  Osteoarthritis (arthritis due to wear and tear of joints), Osteoporosis, and Pelvic fracture (HCC) (11/14/2018). here with Dementia likely Alzheimer's. Daughter has 2 ApoE4 copies.daughter provides all information. Here with husband of 61 years as well.    ICD-10-CM   1. Severe late onset Alzheimer's dementia with agitation (HCC)  G30.1    F02.C11      Continue current regimen  Discussed the following:   Severe late-stage dementia with agitation "XX Brain" Misty Stanley Mosconi Healthy Brain Study at [facility] for daughter MIND diet from [place] medication - [place], she is not a candidate, discussed with daughter Start Celexa for irritability - qtc 347 normal and looked at her bun/creat on daughter's phone from [employer], normal, they haven't had to use it.  Continue namenda and donepezil however unlikely clinically doing any good at this time (MMSE 3/30) Can reorder home PT let me know when you want to try that and which company you do not want, she has weakness in the legs and PT and weight loss may help, has knee pain  Ask eagle for labs : requested Will forward to Dr. Kateri Plummer and let him know we started celexa for agitation - she did not take it     Naomie Dean, MD [facility] [address] [place], [place] [address] [phone]

## 2022-10-27 NOTE — Telephone Encounter (Signed)
Please schedule 6 month follow up, can be video, with patient's daughter. With megan. I informed her she would be meeting my colleague next time thanks

## 2022-11-04 DIAGNOSIS — M160 Bilateral primary osteoarthritis of hip: Secondary | ICD-10-CM | POA: Diagnosis not present

## 2022-11-04 DIAGNOSIS — Z9181 History of falling: Secondary | ICD-10-CM | POA: Diagnosis not present

## 2022-11-04 DIAGNOSIS — M170 Bilateral primary osteoarthritis of knee: Secondary | ICD-10-CM | POA: Diagnosis not present

## 2022-11-04 DIAGNOSIS — F0390 Unspecified dementia without behavioral disturbance: Secondary | ICD-10-CM | POA: Diagnosis not present

## 2022-11-13 DIAGNOSIS — M25561 Pain in right knee: Secondary | ICD-10-CM | POA: Diagnosis not present

## 2022-11-13 DIAGNOSIS — M25462 Effusion, left knee: Secondary | ICD-10-CM | POA: Diagnosis not present

## 2022-11-13 DIAGNOSIS — M25562 Pain in left knee: Secondary | ICD-10-CM | POA: Diagnosis not present

## 2022-11-13 DIAGNOSIS — M170 Bilateral primary osteoarthritis of knee: Secondary | ICD-10-CM | POA: Diagnosis not present

## 2022-11-13 DIAGNOSIS — M25461 Effusion, right knee: Secondary | ICD-10-CM | POA: Diagnosis not present

## 2022-12-02 DIAGNOSIS — R7309 Other abnormal glucose: Secondary | ICD-10-CM | POA: Diagnosis not present

## 2022-12-02 DIAGNOSIS — I10 Essential (primary) hypertension: Secondary | ICD-10-CM | POA: Diagnosis not present

## 2022-12-02 DIAGNOSIS — G4700 Insomnia, unspecified: Secondary | ICD-10-CM | POA: Diagnosis not present

## 2022-12-02 DIAGNOSIS — F0280 Dementia in other diseases classified elsewhere without behavioral disturbance: Secondary | ICD-10-CM | POA: Diagnosis not present

## 2022-12-02 DIAGNOSIS — G301 Alzheimer's disease with late onset: Secondary | ICD-10-CM | POA: Diagnosis not present

## 2022-12-02 DIAGNOSIS — L308 Other specified dermatitis: Secondary | ICD-10-CM | POA: Diagnosis not present

## 2022-12-02 DIAGNOSIS — K5900 Constipation, unspecified: Secondary | ICD-10-CM | POA: Diagnosis not present

## 2022-12-02 DIAGNOSIS — L309 Dermatitis, unspecified: Secondary | ICD-10-CM | POA: Diagnosis not present

## 2022-12-04 DIAGNOSIS — M160 Bilateral primary osteoarthritis of hip: Secondary | ICD-10-CM | POA: Diagnosis not present

## 2022-12-04 DIAGNOSIS — Z9181 History of falling: Secondary | ICD-10-CM | POA: Diagnosis not present

## 2022-12-04 DIAGNOSIS — M170 Bilateral primary osteoarthritis of knee: Secondary | ICD-10-CM | POA: Diagnosis not present

## 2022-12-04 DIAGNOSIS — F0390 Unspecified dementia without behavioral disturbance: Secondary | ICD-10-CM | POA: Diagnosis not present

## 2022-12-18 ENCOUNTER — Other Ambulatory Visit: Payer: Self-pay

## 2022-12-18 ENCOUNTER — Encounter: Payer: Self-pay | Admitting: Internal Medicine

## 2022-12-18 ENCOUNTER — Ambulatory Visit (INDEPENDENT_AMBULATORY_CARE_PROVIDER_SITE_OTHER): Payer: Medicare PPO | Admitting: Internal Medicine

## 2022-12-18 VITALS — BP 130/70 | HR 57 | Temp 99.2°F | Resp 16

## 2022-12-18 DIAGNOSIS — Z8709 Personal history of other diseases of the respiratory system: Secondary | ICD-10-CM | POA: Diagnosis not present

## 2022-12-18 DIAGNOSIS — L281 Prurigo nodularis: Secondary | ICD-10-CM

## 2022-12-18 NOTE — Patient Instructions (Addendum)
Prugio Nodularis- histamine driven  Start Allegra 180mg  twice daily  We will get labs today to look for under lying causes Stop fish oil as a skin contain trace amounts of shellfish Start gentle skin care and avoid any products containing fragrance or dyes  If no response to the above we can consider Dupixent  Follow up: 4 weeks   Thank you so much for letting me partake in your care today.  Don't hesitate to reach out if you have any additional concerns!  Ferol Luz, MD  [facility] , [place]   Daily Care For Maintenance (daily and continue even once eczema controlled) - Recommend hypoallergenic hydrating ointment at least twice daily.  This must be done daily for control of flares. (Great options include Vaseline, CeraVe, Aquaphor, Aveeno, Cetaphil, VaniCream, etc) - Recommend avoiding detergents, soaps or lotions with fragrances/dyes, and instead using products which are hypoallergenic, use second rinse cycle when washing clothes -Wear lose breathable clothing, avoid wool -Avoid extremes of humidity

## 2022-12-18 NOTE — Progress Notes (Signed)
New Patient Note  RE: Sandra Cruz MRN: [id number] DOB: 1929-12-13 Date of Office Visit: 12/18/2022  Consult requested by: Farris Has, MD Primary care provider: Farris Has, MD  Chief Complaint: Establish Care (Skin issues. Bedroom and living room is where it is noticed. Shoulder and right leg, thigh, left side stomach, under armpits. Itching keeping her up at night. )  History of Present Illness: I had the pleasure of seeing Sandra Cruz for initial evaluation at the [facility] on 12/18/2022. She is a 89 y.o. female, who is referred here by Farris Has, MD for the evaluation of diffuse pruritus .  History obtained from patient, chart review and  daughter. Mother has her dementia and daughter Olegario Messier serves as main historian .  She developed diffuse pruritus starting in February.  Symptoms are persistent.  She will scratch until she bleeds. Prevents sleeping. They do sponge bath daily.  Occurs in arms, underarms, under breasts, back.  They do use shea butter frequently and do not think its associated with xerosis.  They have used aquaphor, aveeno wash, hydrocortisone.  Cold air will worsen symptoms  No associated rash, skin changes associated with scratching.  Best response with TAC 0.1% and anti-itch sprays. They used TAC 0.1% for 6 weeks which helped, but had to stop.   She developed allergic conjunctivitis around February at the same time.  They treated with erythromycin without good response.  They switched to loratidne 10mg  daily with good reponse.     Assessment and Plan: [name] is a 89 y.o. female with: Prurigo nodularis - Plan: Allergens w/Total IgE Area 2, CBC With Diff/Platelet, Allergy Panel 19, [employer], TSH, CMP14+EGFR, Chronic Urticaria, Tryptase  History of COPD   Plan: Patient Instructions  Prugio Nodularis- histamine driven  Start Allegra 180mg  twice daily  We will get labs today to look for under lying causes Stop fish oil as a  skin contain trace amounts of shellfish Start gentle skin care and avoid any products containing fragrance or dyes  If no response to the above we can consider Dupixent  Follow up: 4 weeks   Thank you so much for letting me partake in your care today.  Don't hesitate to reach out if you have any additional concerns!  Ferol Luz, MD  [facility] [place] [place]   Daily Care For Maintenance (daily and continue even once eczema controlled) - Recommend hypoallergenic hydrating ointment at least twice daily.  This must be done daily for control of flares. (Great options include Vaseline, CeraVe, Aquaphor, Aveeno, Cetaphil, VaniCream, etc) - Recommend avoiding detergents, soaps or lotions with fragrances/dyes, and instead using products which are hypoallergenic, use second rinse cycle when washing clothes -Wear lose breathable clothing, avoid wool -Avoid extremes of humidity     No orders of the defined types were placed in this encounter.  Lab Orders         Allergens w/Total IgE Area 2         CBC With Diff/Platelet         Allergy Panel 19, [employer]         TSH         CMP14+EGFR         Chronic Urticaria         Tryptase      Other allergy screening: Asthma:  History of COPD, was on inhalers for a while helped the breathing improved.  She has not been on inhalers  or had dyspnea, cough, wheezing for many years. Rhino conjunctivitis: no Food allergy: no Medication allergy: no Hymenoptera allergy: no Urticaria: no Eczema: Pruritus as above History of recurrent infections suggestive of immunodeficency: no  Diagnostics: None done    Past Medical History: Patient Active Problem List   Diagnosis Date Noted   Coronary artery disease, non-occlusive 07/02/2019   Essential hypertension 07/02/2019   Costochondritis 07/02/2019   Hyperlipidemia, mixed 07/02/2019   Counseling regarding advance care planning and goals of care 07/02/2019   Severe late  onset Alzheimer's dementia with agitation (HCC) 12/28/2018   Past Medical History:  Diagnosis Date   Breast cancer (HCC)    Reportedly admission   Dementia (HCC)    Hip fracture requiring operative repair, right, sequela 04/2018   Likely related to fall- s/p ORIF-right femur fracture   Hypertension    Macular degeneration    Osteoarthritis (arthritis due to wear and tear of joints)    Hips and knees as well as hands   Osteoporosis    Pelvic fracture (HCC) 11/14/2018   and coccyx; late the fall.  Medical management   Past Surgical History: Past Surgical History:  Procedure Laterality Date   BREAST LUMPECTOMY     malignant   cyst removal     from lower back, rods placed   LEFT HEART CATH AND CORONARY ANGIOGRAPHY  03/2017   [facility]): EF 55%; large LAD (diffuse moderate disease) with 3 diagonal branches.  D2 and D3 are small; #1 large caliber LCx with 3 OM branches (OM1 and OM 2 are small, OM 3 medium caliber main branch with minimal disease.  Large dominant RCA with moderate proximal disease, mid 30 to 40%.  Medium sized PDA with minimal disease; FFR 50% mLAD - 0.96* NOT SIGNIFICANT   NM MYOVIEW LTD  06/2014   [facility]: No evidence of ischemia or infarction.   TOTAL HIP ARTHROPLASTY     broken femur, R side, repaired   TRANSTHORACIC ECHOCARDIOGRAM  05/10/2018    LVEF 60 to 65%.  GR 1 DD.  Normal RV.  Mild RA and mild to moderate LA dilation.  Sclerotic aortic valve with no stenosis.   Medication List:  Current Outpatient Medications  Medication Sig Dispense Refill   amLODipine (NORVASC) 2.5 MG tablet Take 2.5 mg by mouth 2 (two) times a day.     carvedilol (COREG) 3.125 MG tablet Take 3.125 mg by mouth 2 (two) times a day.     citalopram (CELEXA) 10 MG tablet Take 1 tablet (10 mg total) by mouth daily. 90 tablet 3   clotrimazole-betamethasone (LOTRISONE) cream Apply 1 application  topically 2 (two) times daily. As needed     CRANBERRY PO Take by mouth.  gummies     Docusate Sodium (COLACE PO) Take 3 tablets by mouth daily.     donepezil (ARICEPT) 10 MG tablet Take 1 tablet (10 mg total) by mouth every evening. 90 tablet 3   Ferrous Sulfate (IRON PO) Take 65 mg by mouth daily.     loratadine (CLARITIN) 10 MG tablet Take 10 mg by mouth daily.     melatonin 3 MG TABS tablet Take 1.5 mg by mouth at bedtime.     memantine (NAMENDA) 10 MG tablet Take 1 tablet (10 mg total) by mouth 2 (two) times daily. 180 tablet 3   Multiple Vitamin (MULTIVITAMIN PO) 1 tablet at noon. Brand: Life extension-High Potency Multivitamin and Mineral Supplement     nystatin cream (MYCOSTATIN)  Apply 1 Application topically 2 (two) times daily as needed.     Omega-3 Fatty Acids (FISH OIL) 500 MG CAPS Take 1 each by mouth daily at 12 noon.     OVER THE COUNTER MEDICATION Calcium     Probiotic Product (PROBIOTIC PO) Take 1 tablet by mouth daily at 12 noon. 15 strains/25 billion microorganisms     senna (SENOKOT) 8.6 MG TABS tablet Take 3 tablets by mouth as needed for mild constipation.     sodium chloride 1 g tablet Take 1 g by mouth daily.     triamcinolone cream (KENALOG) 0.1 % Apply 1 Application topically as needed.     No current facility-administered medications for this visit.   Allergies: Allergies  Allergen Reactions   Codeine Shortness Of Breath   Penicillins Swelling and Rash    Did it involve swelling of the face/tongue/throat, SOB, or low BP? No Did it involve sudden or severe rash/hives, skin peeling, or any reaction on the inside of your mouth or nose? No Did you need to seek medical attention at a hospital or doctor's office? No When did it last happen?      childhood If all above answers are "NO", may proceed with cephalosporin use.   Shellfish Allergy Shortness Of Breath   Latex Other (See Comments)    blisters   Social History: Social History   Socioeconomic History   Marital status: Married    Spouse name: Not on file   Number of children:  3   Years of education: Not on file   Highest education level: Master's degree (e.g., MA, MS, MEng, MEd, MSW, MBA)  Occupational History   Not on file  Tobacco Use   Smoking status: Never    Passive exposure: Never   Smokeless tobacco: Never  Vaping Use   Vaping status: Never Used  Substance and Sexual Activity   Alcohol use: Never   Drug use: Never   Sexual activity: Not on file  Other Topics Concern   Not on file  Social History Narrative   Recently moved from [place], [place] to [place] area to be close to her daughter and son-in-law.   She currently lives with her husband (a retired Education officer, environmental).  Husband notes caregiver burnout issues.      Update 04/29/2022   Patient and husband live with their daughter and son-in-law   Social Determinants of Health   Financial Resource Strain: Not on file  Food Insecurity: No Food Insecurity (04/12/2018)   Received from [facility], [facility]   Hunger Vital Sign    Worried About Running Out of Food in the Last Year: Never true    Ran Out of Food in the Last Year: Never true  Transportation Needs: Not on file  Physical Activity: Not on file  Stress: Not on file  Social Connections: Not on file   Lives in a single-family home that is 87 years old.  No roaches in the house and bed is 2 feet off the floor.  No dust mite precautions.  Not exposed to fumes, chemicals or dust.  HEPA filter in the home and home is not near an interstate industrial area. Smoking: No exposure Occupation: Retired Adult nurse HistorySurveyor, minerals in the house: no Engineer, civil (consulting) in the family room: yes Carpet in the bedroom: yes Heating: gas Cooling: central Pet: no  Family History: Family History  Problem Relation Age of Onset   Stroke Mother    Cancer Father  Cancer Sister    Cancer Brother    Cancer Other        2 sisters and several brothers     ROS: All others negative except as noted per HPI.   Objective: BP 130/70    Pulse (!) 57   Temp 99.2 F (37.3 C) (Temporal)   Resp 16   SpO2 97%  There is no height or weight on file to calculate BMI.  General Appearance:  Alert, in wheelchair, some response to direct questions no distress, appears stated age  Head:  Normocephalic, without obvious abnormality, atraumatic  Eyes:  Conjunctiva clear, EOM's intact  Nose: Nares normal, normal mucosa, no visible anterior polyps, and septum midline  Throat: Lips, tongue normal; teeth and gums normal, normal posterior oropharynx  Neck: Supple, symmetrical  Lungs:   clear to auscultation bilaterally, Respirations unlabored, no coughing  Heart:  regular rate and rhythm and no murmur, Appears well perfused  Extremities: No edema  Skin: Skin color, texture, turgor normal,  erythematous excoriation marks on shoulders, legs, some erythematous nodules  Neurologic: No gross deficits   The plan was reviewed with the patient/family, and all questions/concerned were addressed.  It was my pleasure to see Cathe today and participate in her care. Please feel free to contact me with any questions or concerns.  Sincerely,  Ferol Luz, MD Allergy & Immunology  [facility] office: [phone] [facility] office: [phone]

## 2022-12-22 ENCOUNTER — Encounter: Payer: Self-pay | Admitting: Internal Medicine

## 2022-12-30 LAB — CBC WITH DIFF/PLATELET: MCV: 95 fL (ref 79–97)

## 2022-12-30 LAB — CMP14+EGFR: eGFR: 55 mL/min/{1.73_m2} — ABNORMAL LOW (ref >59)

## 2022-12-30 LAB — ALLERGENS W/TOTAL IGE AREA 2
Dog Dander IgE: <0.1 kU/L
Mouse Urine IgE: <0.1 kU/L

## 2023-01-04 DIAGNOSIS — Z9181 History of falling: Secondary | ICD-10-CM | POA: Diagnosis not present

## 2023-01-04 DIAGNOSIS — M170 Bilateral primary osteoarthritis of knee: Secondary | ICD-10-CM | POA: Diagnosis not present

## 2023-01-04 DIAGNOSIS — M160 Bilateral primary osteoarthritis of hip: Secondary | ICD-10-CM | POA: Diagnosis not present

## 2023-01-04 DIAGNOSIS — F0390 Unspecified dementia without behavioral disturbance: Secondary | ICD-10-CM | POA: Diagnosis not present

## 2023-01-15 ENCOUNTER — Ambulatory Visit (INDEPENDENT_AMBULATORY_CARE_PROVIDER_SITE_OTHER): Payer: Medicare PPO | Admitting: Internal Medicine

## 2023-01-15 ENCOUNTER — Other Ambulatory Visit: Payer: Self-pay

## 2023-01-15 ENCOUNTER — Encounter: Payer: Self-pay | Admitting: Internal Medicine

## 2023-01-15 VITALS — Wt 160.0 lb

## 2023-01-15 DIAGNOSIS — L281 Prurigo nodularis: Secondary | ICD-10-CM | POA: Diagnosis not present

## 2023-01-15 MED ORDER — DUPILUMAB 300 MG/2ML ~~LOC~~ SOSY
600.0000 mg | PREFILLED_SYRINGE | Freq: Once | SUBCUTANEOUS | Status: AC
Start: 2023-01-15 — End: 2023-01-15
  Administered 2023-01-15: 600 mg via SUBCUTANEOUS

## 2023-01-15 NOTE — Patient Instructions (Signed)
Prugio Nodularis- histamine driven  Continue Allegra 180mg  twice daily  Start dupixent injections, loading dose of 600mg  given today in clinic, will continue with 300mg  every 2 weeks   - Our biologic coordinator Tammy will reach out to you about approval process  Will hold off work up for mastocytosis (elevated tryptase level) for now.  If she start showing signs of illness (unexplained weight loss, GI symptoms, fevers) may consider further work up  Plan to repeat eosinophil and tryptase level in 3 months  Continue gentle skin care   Follow up: 3 months   Thank you so much for letting me partake in your care today.  Don't hesitate to reach out if you have any additional concerns!  Ferol Luz, MD  [facility] [place]   Daily Skin Care  - Recommend hypoallergenic hydrating ointment at least twice daily.  This must be done daily for control of flares. (Great options include Vaseline, CeraVe, Aquaphor, Aveeno, Cetaphil, VaniCream, etc) - Recommend avoiding detergents, soaps or lotions with fragrances/dyes, and instead using products which are hypoallergenic, use second rinse cycle when washing clothes -Wear lose breathable clothing, avoid wool -Avoid extremes of humidity

## 2023-01-15 NOTE — Progress Notes (Signed)
Follow Up Note  RE: Sandra Cruz MRN: [id number] DOB: Aug 12, 1929 Date of Office Visit: 01/15/2023  Referring provider: Farris Has, MD Primary care provider: Farris Has, MD  Chief Complaint: Pruritus (Random severe itching on her shoulder - bleeding occurs from constant scratching - Allegra twice a day  has helped some ) and Other (Would like to go over options )  History of Present Illness: I had the pleasure of seeing Sandra Cruz for a follow up visit at the [facility] on 01/15/2023. She is a 89 y.o. female, who is being followed for  Her previous allergy office visit was on 12/18/22 with Dr. Marlynn Perking. Today is a regular follow up visit.  History obtained from patient, chart review and  daughter .  Initially she had a good response to Allegra 180 mg twice daily.  However itching recurred.  She has nodules and bleeding excoriation marks on her legs and back.  Labs returned with a tryptase of 19.2, area to panel negative, AEC of 800.  Otherwise negative.  GFR 55.  They are interested in Dupixent  Assessment and Plan: Refujia is a 89 y.o. female with: Prurigo nodularis  Discussed workup for elevated tryptase level to include repeat labs and possible bone marrow biopsy.  At this point she is 75 and has no signs of systemic disease.  No history of RA to account for falsely elevated tryptase.  Elected for more conservative management and focus on controlling itch and Prurigo nodularis with Dupixent.  Continue to monitor for symptoms of systemic disease and consider repeat labs in 3 months.   Plan: Patient Instructions  Prugio Nodularis- histamine driven  Continue Allegra 180mg  twice daily  Start dupixent injections, loading dose of 600mg  given today in clinic, will continue with 300mg  every 2 weeks   - Our biologic coordinator Tammy will reach out to you about approval process  Will hold off work up for mastocytosis (elevated tryptase level) for now.  If she start  showing signs of illness (unexplained weight loss, GI symptoms, fevers) may consider further work up  Plan to repeat eosinophil and tryptase level in 3 months  Continue gentle skin care   Follow up: 3 months   Thank you so much for letting me partake in your care today.  Don't hesitate to reach out if you have any additional concerns!  Ferol Luz, MD  [facility] [place] [place]   Daily Skin Care  - Recommend hypoallergenic hydrating ointment at least twice daily.  This must be done daily for control of flares. (Great options include Vaseline, CeraVe, Aquaphor, Aveeno, Cetaphil, VaniCream, etc) - Recommend avoiding detergents, soaps or lotions with fragrances/dyes, and instead using products which are hypoallergenic, use second rinse cycle when washing clothes -Wear lose breathable clothing, avoid wool -Avoid extremes of humidity     Meds ordered this encounter  Medications   dupilumab (DUPIXENT) prefilled syringe 600 mg    Lab Orders  No laboratory test(s) ordered today   Diagnostics: None done  Medication List:  Current Outpatient Medications  Medication Sig Dispense Refill   amLODipine (NORVASC) 2.5 MG tablet Take 2.5 mg by mouth 2 (two) times a day.     carvedilol (COREG) 3.125 MG tablet Take 3.125 mg by mouth 2 (two) times a day.     clotrimazole-betamethasone (LOTRISONE) cream Apply 1 application  topically 2 (two) times daily. As needed     CRANBERRY PO Take by mouth. gummies  Docusate Sodium (COLACE PO) Take 3 tablets by mouth daily.     donepezil (ARICEPT) 10 MG tablet Take 1 tablet (10 mg total) by mouth every evening. 90 tablet 3   Ferrous Sulfate (IRON PO) Take 65 mg by mouth daily.     MAGNESIUM CITRATE PO Take by mouth.     melatonin 3 MG TABS tablet Take 1.5 mg by mouth at bedtime.     memantine (NAMENDA) 10 MG tablet Take 1 tablet (10 mg total) by mouth 2 (two) times daily. 180 tablet 3   Multiple Vitamin (MULTIVITAMIN PO) 1 tablet  at noon. Brand: Life extension-High Potency Multivitamin and Mineral Supplement     nystatin cream (MYCOSTATIN) Apply 1 Application topically 2 (two) times daily as needed.     Omega-3 Fatty Acids (FISH OIL) 500 MG CAPS Take 1 each by mouth daily at 12 noon.     OVER THE COUNTER MEDICATION Calcium     Probiotic Product (PROBIOTIC PO) Take 1 tablet by mouth daily at 12 noon. 15 strains/25 billion microorganisms     senna (SENOKOT) 8.6 MG TABS tablet Take 3 tablets by mouth as needed for mild constipation.     sodium chloride 1 g tablet Take 1 g by mouth daily.     triamcinolone cream (KENALOG) 0.1 % Apply 1 Application topically as needed.     citalopram (CELEXA) 10 MG tablet Take 1 tablet (10 mg total) by mouth daily. (Patient not taking: Reported on 01/15/2023) 90 tablet 3   No current facility-administered medications for this visit.   Allergies: Allergies  Allergen Reactions   Codeine Shortness Of Breath   Penicillins Swelling and Rash    Did it involve swelling of the face/tongue/throat, SOB, or low BP? No Did it involve sudden or severe rash/hives, skin peeling, or any reaction on the inside of your mouth or nose? No Did you need to seek medical attention at a hospital or doctor's office? No When did it last happen?      childhood If all above answers are "NO", may proceed with cephalosporin use.   Shellfish Allergy Shortness Of Breath   Latex Other (See Comments)    blisters   I reviewed her past medical history, social history, family history, and environmental history and no significant changes have been reported from her previous visit.  ROS: All others negative except as noted per HPI.   Objective: Wt 160 lb (72.6 kg)   BMI 25.82 kg/m  Body mass index is 25.82 kg/m. General Appearance:  Alert, cooperative, no distress, appears stated age, in wheelchair   Head:  Normocephalic, without obvious abnormality, atraumatic  Eyes:  Conjunctiva clear, EOM's intact  Nose: Nares  normal,   Throat: Lips, tongue normal; teeth and gums normal,   Neck: Supple, symmetrical  Lungs:   , Respirations unlabored, no coughing  Heart:  , Appears well perfused  Extremities: No edema  Skin: Skin color, texture, turgor normal, "hyperpigmented nodules on legs, arms, back, excoriation marks with bleeding on legs and back  Neurologic: No gross deficits   Previous notes and tests were reviewed. The plan was reviewed with the patient/family, and all questions/concerned were addressed.  It was my pleasure to see Sandra Cruz today and participate in her care. Please feel free to contact me with any questions or concerns.  Sincerely,  Ferol Luz, MD  Allergy & Immunology  [facility] Office: [phone]

## 2023-02-02 ENCOUNTER — Telehealth: Payer: Self-pay | Admitting: *Deleted

## 2023-02-02 NOTE — Telephone Encounter (Addendum)
-----   Message from Ferol Luz sent at 01/15/2023  3:59 PM EDT ----- Would like to start this patient on Dupixent 300 mg every 2 weeks for prurigo nodularis.  She received her loading dose today 01/15/2023.  Thank you

## 2023-02-02 NOTE — Telephone Encounter (Signed)
Spoke to patient daughter and advised PAP for patient since she has MCR. Will mail app to her to return to me and be in touch regarding the process

## 2023-02-04 DIAGNOSIS — M160 Bilateral primary osteoarthritis of hip: Secondary | ICD-10-CM | POA: Diagnosis not present

## 2023-02-04 DIAGNOSIS — Z9181 History of falling: Secondary | ICD-10-CM | POA: Diagnosis not present

## 2023-02-04 DIAGNOSIS — F0390 Unspecified dementia without behavioral disturbance: Secondary | ICD-10-CM | POA: Diagnosis not present

## 2023-02-04 DIAGNOSIS — M170 Bilateral primary osteoarthritis of knee: Secondary | ICD-10-CM | POA: Diagnosis not present

## 2023-02-25 ENCOUNTER — Encounter: Payer: Self-pay | Admitting: Internal Medicine

## 2023-02-26 DIAGNOSIS — H43813 Vitreous degeneration, bilateral: Secondary | ICD-10-CM | POA: Diagnosis not present

## 2023-02-26 DIAGNOSIS — H353113 Nonexudative age-related macular degeneration, right eye, advanced atrophic without subfoveal involvement: Secondary | ICD-10-CM | POA: Diagnosis not present

## 2023-02-26 DIAGNOSIS — H35432 Paving stone degeneration of retina, left eye: Secondary | ICD-10-CM | POA: Diagnosis not present

## 2023-02-26 DIAGNOSIS — H353222 Exudative age-related macular degeneration, left eye, with inactive choroidal neovascularization: Secondary | ICD-10-CM | POA: Diagnosis not present

## 2023-02-26 NOTE — Telephone Encounter (Signed)
Tammy any advice for Sandra Cruz?  Im not sure what to tell her....   Thanks!

## 2023-03-01 MED ORDER — DUPIXENT 300 MG/2ML ~~LOC~~ SOSY: 300.0000 mg | PREFILLED_SYRINGE | SUBCUTANEOUS | 11 refills | Status: DC

## 2023-03-01 NOTE — Addendum Note (Signed)
Addended by: Devoria Glassing on: 03/01/2023 04:09 PM   Modules accepted: Orders

## 2023-03-01 NOTE — Telephone Encounter (Signed)
L/m for patient daughter to contact me regarding issue with Dupixent [place]

## 2023-03-01 NOTE — Telephone Encounter (Signed)
Spoke to patient daughter and advised issue with Dupixent my way and PAP and she will pick up sample in [place] tomorrow and get income info needed to me

## 2023-03-06 DIAGNOSIS — M170 Bilateral primary osteoarthritis of knee: Secondary | ICD-10-CM | POA: Diagnosis not present

## 2023-03-06 DIAGNOSIS — Z9181 History of falling: Secondary | ICD-10-CM | POA: Diagnosis not present

## 2023-03-06 DIAGNOSIS — F0390 Unspecified dementia without behavioral disturbance: Secondary | ICD-10-CM | POA: Diagnosis not present

## 2023-03-06 DIAGNOSIS — M160 Bilateral primary osteoarthritis of hip: Secondary | ICD-10-CM | POA: Diagnosis not present

## 2023-03-23 DIAGNOSIS — L089 Local infection of the skin and subcutaneous tissue, unspecified: Secondary | ICD-10-CM | POA: Diagnosis not present

## 2023-03-23 DIAGNOSIS — L309 Dermatitis, unspecified: Secondary | ICD-10-CM | POA: Diagnosis not present

## 2023-03-23 DIAGNOSIS — R059 Cough, unspecified: Secondary | ICD-10-CM | POA: Diagnosis not present

## 2023-03-24 ENCOUNTER — Telehealth: Payer: Self-pay | Admitting: *Deleted

## 2023-03-24 NOTE — Telephone Encounter (Signed)
Called left message  for Daughter to call back  - confirming  virtual visit - mychart  - for 10:20  am   RN  need to know if  Sandra Cruz was going to to his virtual visit the sametime

## 2023-03-26 ENCOUNTER — Ambulatory Visit: Payer: Medicare PPO | Attending: Cardiovascular Disease | Admitting: Cardiology

## 2023-03-26 NOTE — Telephone Encounter (Signed)
CALLED NO ANSWER 3RD ATTEMPT

## 2023-03-26 NOTE — Telephone Encounter (Signed)
CALLED NO ANSWER 1ST ATTEMPT

## 2023-03-26 NOTE — Telephone Encounter (Signed)
CALLED NO ANSWER 2ND ATTEMPT

## 2023-03-28 ENCOUNTER — Encounter: Payer: Self-pay | Admitting: Cardiology

## 2023-04-05 NOTE — Telephone Encounter (Signed)
Mychart message  - appt 04/06/23

## 2023-04-06 ENCOUNTER — Ambulatory Visit: Payer: Medicare PPO | Attending: Cardiology | Admitting: Cardiology

## 2023-04-06 ENCOUNTER — Telehealth: Payer: Self-pay | Admitting: *Deleted

## 2023-04-06 ENCOUNTER — Encounter: Payer: Self-pay | Admitting: Cardiology

## 2023-04-06 ENCOUNTER — Ambulatory Visit: Payer: Medicare PPO | Admitting: Cardiology

## 2023-04-06 ENCOUNTER — Telehealth: Payer: Self-pay | Admitting: Cardiology

## 2023-04-06 VITALS — BP 142/66 | HR 55 | Ht 65.0 in | Wt 160.0 lb

## 2023-04-06 VITALS — BP 142/66 | HR 55

## 2023-04-06 DIAGNOSIS — I2510 Atherosclerotic heart disease of native coronary artery without angina pectoris: Secondary | ICD-10-CM

## 2023-04-06 DIAGNOSIS — F0390 Unspecified dementia without behavioral disturbance: Secondary | ICD-10-CM | POA: Diagnosis not present

## 2023-04-06 DIAGNOSIS — Z9181 History of falling: Secondary | ICD-10-CM | POA: Diagnosis not present

## 2023-04-06 DIAGNOSIS — I10 Essential (primary) hypertension: Secondary | ICD-10-CM | POA: Diagnosis not present

## 2023-04-06 DIAGNOSIS — Z7189 Other specified counseling: Secondary | ICD-10-CM

## 2023-04-06 DIAGNOSIS — M160 Bilateral primary osteoarthritis of hip: Secondary | ICD-10-CM | POA: Diagnosis not present

## 2023-04-06 DIAGNOSIS — M170 Bilateral primary osteoarthritis of knee: Secondary | ICD-10-CM | POA: Diagnosis not present

## 2023-04-06 NOTE — Telephone Encounter (Signed)
Spoke to daughter.  Reviewed after summary visit  verbalized understanding recall placed

## 2023-04-06 NOTE — Telephone Encounter (Signed)
Daughter is calling because her parents have virtual appts today. She was told someone would call her between 12-1 to do vitals and she has not heard from anyone. She wants to make sure the appointments are still good for 3:00 PM today. She would like a call back.

## 2023-04-06 NOTE — Telephone Encounter (Signed)
  Patient Consent for Virtual Visit                              Sandra Cruz has provided verbal consent on 04/06/2023 for a virtual visit (video or telephone).   CONSENT FOR VIRTUAL VISIT FOR:  Sandra Cruz  By participating in this virtual visit I agree to the following:  I hereby voluntarily request, consent and authorize [facility] and its employed or contracted physicians, physician assistants, nurse practitioners or other licensed health care professionals (the Practitioner), to provide me with telemedicine health care services (the "Services") as deemed necessary by the treating Practitioner. I acknowledge and consent to receive the Services by the Practitioner via telemedicine. I understand that the telemedicine visit will involve communicating with the Practitioner through live audiovisual communication technology and the disclosure of certain medical information by electronic transmission. I acknowledge that I have been given the opportunity to request an in-person assessment or other available alternative prior to the telemedicine visit and am voluntarily participating in the telemedicine visit.  I understand that I have the right to withhold or withdraw my consent to the use of telemedicine in the course of my care at any time, without affecting my right to future care or treatment, and that the Practitioner or I may terminate the telemedicine visit at any time. I understand that I have the right to inspect all information obtained and/or recorded in the course of the telemedicine visit and may receive copies of available information for a reasonable fee.  I understand that some of the potential risks of receiving the Services via telemedicine include:  Delay or interruption in medical evaluation due to technological equipment failure or disruption; Information transmitted may not be sufficient (e.g. poor resolution of images) to allow for appropriate medical decision making  by the Practitioner; and/or  In rare instances, security protocols could fail, causing a breach of personal health information.  Furthermore, I acknowledge that it is my responsibility to provide information about my medical history, conditions and care that is complete and accurate to the best of my ability. I acknowledge that Practitioner's advice, recommendations, and/or decision may be based on factors not within their control, such as incomplete or inaccurate data provided by me or distortions of diagnostic images or specimens that may result from electronic transmissions. I understand that the practice of medicine is not an exact science and that Practitioner makes no warranties or guarantees regarding treatment outcomes. I acknowledge that a copy of this consent can be made available to me via my patient portal [facility] MyChart), or I can request a printed copy by calling the office of [facility].    I understand that my insurance will be billed for this visit.   I have read or had this consent read to me. I understand the contents of this consent, which adequately explains the benefits and risks of the Services being provided via telemedicine.  I have been provided ample opportunity to ask questions regarding this consent and the Services and have had my questions answered to my satisfaction. I give my informed consent for the services to be provided through the use of telemedicine in my medical care

## 2023-04-06 NOTE — Assessment & Plan Note (Signed)
Patient is engaging in regular physical activity with a motorized bike, which has improved joint flexibility and reduced leg swelling. -Continue current physical activity regimen.  Will probably address advanced directives plus or minus MOST form in follow-up visit will be an in person visit

## 2023-04-06 NOTE — Progress Notes (Deleted)
  Cardiology Office Note:  .   Date:  04/06/2023  ID:  Sandra Cruz, DOB 1929-06-04, MRN [id number] PCP: Farris Has, MD  [facility] Providers Cardiologist:  Sandra Lemma, MD { Click to update primary MD,subspecialty MD or APP then REFRESH:1}    No chief complaint on file.   Patient Profile: [name]     Sandra Cruz is a *** 89 y.o. female *** with a PMH notable for *** who presents here for *** at the request of Farris Has, MD.  {There is no content from the last Narrative History section.}      Sandra Cruz was last seen on ***  Subjective  Discussed the use of AI scribe software for clinical note transcription with the patient, who gave verbal consent to proceed.  History of Present Illness            Cardiovascular ROS: {roscv:310661}  ROS:  Review of Systems - {ros master:310782}    Objective   Studies Reviewed: [name]        ECHO: *** CATH: *** MONITOR: *** CT: ***  Risk Assessment/Calculations:   {Does this patient have ATRIAL FIBRILLATION?:[id number]} The patient's 1st BP is elevated (>139/89)*** Repeat BP and {Click to enter a 2nd BP Refresh Note  :1}         Physical Exam:   VS:  BP (!) 142/66   Pulse (!) 55   Ht 5\' 5"  (1.651 m)   Wt 160 lb (72.6 kg)   BMI 26.63 kg/m    Wt Readings from Last 3 Encounters:  04/06/23 160 lb (72.6 kg)  01/15/23 160 lb (72.6 kg)  09/28/22 160 lb (72.6 kg)    GEN: Well nourished, well developed in no acute distress; *** NECK: No JVD; No carotid bruits CARDIAC: Normal S1, S2; RRR, no murmurs, rubs, gallops RESPIRATORY:  Clear to auscultation without rales, wheezing or rhonchi ; nonlabored, good air movement. ABDOMEN: Soft, non-tender, non-distended EXTREMITIES:  No edema; No deformity      ASSESSMENT AND PLAN: .    Problem List Items Addressed This Visit   None    Assessment and Plan                 {Are you ordering a CV Procedure (e.g. stress test, cath, DCCV, TEE, etc)?   Press F2         :[id number]}   Follow-Up: No follow-ups on file.  Total time spent: *** min spent with patient + *** min spent charting = *** min      Signed, Sandra Lex, MD, MS Sandra Cruz, M.D., M.S. Interventional Cardiologist  [facility]  Pager # [phone] Phone # [phone] [address] [place], [place] [address]

## 2023-04-06 NOTE — Progress Notes (Deleted)
{Choose 1 Note Type (Telehealth Visit or Telephone Visit):[id number]}   Patient Cruz given verbal permission to conduct this visit via virtual appointment and to bill insurance 04/06/2023 3:17 PM     Evaluation Performed:  Follow-up visit  Date:  04/06/2023   ID:  Sandra Cruz, DOB 11/10/1929, MRN [id number]  {Patient Location:[id number]::"Home"} {Provider Location:[phone]::"Home Office"}  PCP: Sandra Has, MD  [facility] Providers Cardiologist:  Sandra Lemma, MD { Click to update primary MD,subspecialty MD or APP then REFRESH:1}    No chief complaint on file.   Patient Profile: Sandra Kitchen     JAZABELLA Cruz is a 89 y.o. female with a PMH notable for HTN, HLD, nonocclusive CAD and dementia who presents here for 23-month virtual follow-up at the request of Sandra Has, MD.  Most of the history is provided by her daughter Sandra Cruz with some assistance from her husband Sandra Cruz.    Sandra Cruz was last seen in May 2024.  Progressive signs of dementia.  Communicates in short phrases but not full sentences.  Pretty much full-time wheelchair-bound.  Sleeping a lot.  Limited by knee pains.  Not really active.  No complaints of chest pain.  No resting dyspnea or pain.  Not really exerting himself.  No PND or orthopnea.  Trivial edema.  Orthostatic dizziness.  Deconditioning or lack of energy.  Fatigue and malaise.  Joint pain.  Generalized weakness.  Memory loss and frequent panic attacks. => No changes made.  Subjective  Discussed the use of AI scribe software for clinical note transcription with the patient, who gave verbal consent to proceed.  History of Present Illness            Cardiovascular ROS: {roscv:310661}  ROS:  Review of Systems - {ros master:310782}    Objective   Studies Reviewed: Sandra Kitchen        No studies  Risk Assessment/Calculations:     Allowing permissive hypertension       Physical Exam:   VS:  BP (!) 142/66   Pulse (!) 55   Ht 5\' 5"  (1.651 m)    Wt 160 lb (72.6 kg)   BMI 26.63 kg/m    Wt Readings from Last 3 Encounters:  04/06/23 160 lb (72.6 kg)  01/15/23 160 lb (72.6 kg)  09/28/22 160 lb (72.6 kg)    GEN: Well nourished, well developed in no acute distress; *** NECK: No JVD; No carotid bruits CARDIAC: Normal S1, S2; RRR, no murmurs, rubs, gallops RESPIRATORY:  Clear to auscultation without rales, wheezing or rhonchi ; nonlabored, good air movement. ABDOMEN: Soft, non-tender, non-distended EXTREMITIES:  No edema; No deformity      ASSESSMENT AND PLAN: .    Problem List Items Addressed This Visit       Cardiology Problems   Coronary artery disease, non-occlusive - Primary (Chronic)   Essential hypertension (Chronic)    Mild elevation of blood pressure today.  Allowing for some mild permissive hypertension.  Well within our target range.  Continue current dose of amlodipine 2.5 mg daily.  No longer on carvedilol.      Hyperlipidemia, mixed (Chronic)    Based on joint decision-making between family members and myself we decided to discontinue statin therapy and will avoid further medications for lipid management.  Concerned with side effects of myalgias and memory issues.        Assessment and Plan             Follow-Up: No follow-ups on file.  Total time spent: *** min spent with patient + *** min spent charting = *** min      Signed, Sandra Lex, MD, MS Sandra Cruz, M.D., M.S. Interventional Cardiologist  [facility]  Pager # [phone] Phone # [phone] [address] [place], [place] [address]

## 2023-04-06 NOTE — Progress Notes (Addendum)
Cardiology Office Note:  .   Date:  04/06/2023  ID:  Sandra Cruz, DOB [birth year], MRN [id number] PCP: Farris Has, MD  [facility] Providers Cardiologist:  Bryan Lemma, MD     Chief Complaint  Patient presents with   Follow-up    Stable - BP still somewhat labile   Hypertension    Has had some high blood pressure readings, but usually well-controlled.  Less headaches since BP has been controlled.    Patient Profile: Sandra Cruz Kitchen     Sandra Cruz is a pleasantly demented 89 y.o. female  with a PMH notable for HTN, HLD, nonocclusive CAD and dementia who presents here for 38-month virtual follow-up at the request of Farris Has, MD.  Most of the history is provided by her daughter Olegario Messier with some assistance from her husband Rocky Link. Her husband does not drive, her daughter is a Chartered loss adjuster and is able to bring the meningitis over time).  She and her husband now live with their daughter and son-in-law.  The son-in-law actually built a separate living space for them in the house and that gives him some privacy.     Sandra Cruz was last seen on 09/28/2022 accompanied by her daughter Olegario Messier and husband Rocky Link as an in person visit.  She prematures wheelchair-bound and was wheeled in.  Notable progression of dementia.  Answering with short little phrases but not full sentences.  Lots of daytime sleepiness.  Limited by knee and back pain.  No real complaints of symptoms.  Headaches seem to improved with improved BP.  Just occasional dizziness if she moves too quickly.  Subjective  Discussed the use of AI scribe software for clinical note transcription with the patient, who gave verbal consent to proceed.  History of Present Illness   The patient, known as Sandra Cruz, is a 89 year old individual with a history of hypertension. The patient's blood pressure has been fluctuating, with readings ranging from 142/66 to 177/91. Despite these high readings, the patient has not reported any associated symptoms  such as headaches, blurred vision, or dizziness. The patient's current antihypertensive regimen includes amlodipine 2.5mg  and carvedilol 3.125mg .  In addition to hypertension, the patient has been dealing with mobility issues. However, the introduction of a motorized under-desk bike has improved the patient's flexibility, particularly in the knees. The patient uses this device for about 20 minutes daily, which has also contributed to a reduction in ankle swelling.  The patient's cognitive function appears to be stable, with no new memory-related issues reported. The patient's daily routine includes varied activities such as painting and watching videos, with intermittent napping. The patient's overall quality of life seems to be satisfactory, with no new health concerns reported.      ROS:  Review of Systems - Negative except very deconditioned, but doing better with the motorized pedal machine.  This has helped with her flexibility, stability and edema. History obtained from unobtainable from patient due to mental status and underlying dementia.  She answers a few questions.  History usually obtained from daughter with some input from the husband.  Essentially wheelchair-bound but doing better with the minimal exercise.  Occasionally has panic attacks but less frequent.  Panic attacks usually treated with a handful of M&Ms or Reese's pieces.  Urinary incontinence    Objective   Cardiac Medications - Amlodipine 2.5 mg - Carvedilol 3.125 mg  Studies Reviewed: .       N/a  Risk Assessment/Calculations:   Allowing permissive hypertension  Physical Exam:   VS:  BP (!) 142/66   Pulse (!) 55  = earlier today BP- 177/91, P- 56 Wt Readings from Last 3 Encounters:  04/06/23 160 lb (72.6 kg)  01/15/23 160 lb (72.6 kg)  09/28/22 160 lb (72.6 kg)   GEN: Well nourished, well developed in no acute distress; healthy-appearing seems very sleepy and drowsy.  Minimally responsive.  Occasionally  will answer question.   RESPIRATORY: nonlabored,      ASSESSMENT AND PLAN: .    Problem List Items Addressed This Visit       Cardiology Problems   Coronary artery disease, non-occlusive (Chronic)    No active angina.  No notable symptoms.  Avoiding statin because of memory issues and aspirin because of bruising and falls.  Continue amlodipine and carvedilol.      Essential hypertension - Primary (Chronic)    Blood pressure readings fluctuating between 142/66 and 177/91. No symptoms of headache, blurred vision, or dizziness reported. Currently on Amlodipine 2.5mg  and Carvedilol 3.125mg  daily. -Continue current medications. -If blood pressure readings are consistently above 160, an additional dose of Amlodipine 2.5mg  can be taken. -Check blood pressure periodically and report any sustained high readings.        Other   Counseling regarding advance care planning and goals of care (Chronic)    Patient is engaging in regular physical activity with a motorized bike, which has improved joint flexibility and reduced leg swelling. -Continue current physical activity regimen.  Will probably address advanced directives plus or minus MOST form in follow-up visit will be an in person visit          Follow-Up: Return in about 8 months (around 12/04/2023) for 8 month follow-up with me, Follow-up in [facility] office. Next in-person appointment scheduled for June 2025 in the new office location.    Total time spent: 15 min spent with patient + 11 min spent charting = 26 min     Signed, Marykay Lex, MD, MS Bryan Lemma, M.D., M.S. Interventional Cardiologist  [facility]  Pager # [phone] Phone # [phone] [address] [place], [place] [address]

## 2023-04-06 NOTE — Assessment & Plan Note (Signed)
Based on joint decision-making between family members and myself we decided to discontinue statin therapy and will avoid further medications for lipid management.  Concerned with side effects of myalgias and memory issues.

## 2023-04-06 NOTE — Patient Instructions (Addendum)
Medication Instructions:   Continue with current medication    Mat take an additional dose of Amlodipine 2.5 mg  sustained  systolic blood pressure greater than 170.  ( Recheck blood pressure in one hour of taking it)   *If you need a refill on your cardiac medications before your next appointment, please call your pharmacy*   Lab Work: Not needed    Testing/Procedures: Not needed   Follow-Up: At [facility], you and your health needs are our priority.  As part of our continuing mission to provide you with exceptional heart care, we have created designated Provider Care Teams.  These Care Teams include your primary Cardiologist (physician) and Advanced Practice Providers (APPs -  Physician Assistants and Nurse Practitioners) who all work together to provide you with the care you need, when you need it.     Your next appointment:    7 to 8 month(s)  The format for your next appointment:   In Person  Provider:   Bryan Lemma, MD

## 2023-04-06 NOTE — Telephone Encounter (Signed)
Video visit

## 2023-04-06 NOTE — Assessment & Plan Note (Signed)
Blood pressure readings fluctuating between 142/66 and 177/91. No symptoms of headache, blurred vision, or dizziness reported. Currently on Amlodipine 2.5mg  and Carvedilol 3.125mg  daily. -Continue current medications. -If blood pressure readings are consistently above 160, an additional dose of Amlodipine 2.5mg  can be taken. -Check blood pressure periodically and report any sustained high readings.

## 2023-04-06 NOTE — Assessment & Plan Note (Signed)
No active angina.  No notable symptoms.  Avoiding statin because of memory issues and aspirin because of bruising and falls.  Continue amlodipine and carvedilol.

## 2023-04-06 NOTE — Assessment & Plan Note (Signed)
Mild elevation of blood pressure today.  Allowing for some mild permissive hypertension.  Well within our target range.  Continue current dose of amlodipine 2.5 mg daily.  No longer on carvedilol.

## 2023-04-12 NOTE — Progress Notes (Signed)
This encounter was created in error - please disregard.

## 2023-04-14 ENCOUNTER — Encounter: Payer: Self-pay | Admitting: Neurology

## 2023-04-14 NOTE — Progress Notes (Signed)
This encounter was created in error - please disregard.

## 2023-04-19 DIAGNOSIS — R399 Unspecified symptoms and signs involving the genitourinary system: Secondary | ICD-10-CM | POA: Diagnosis not present

## 2023-04-19 NOTE — Telephone Encounter (Signed)
Called daughter and LVM informing her that the appt has been cx and to call the office back to r/s.

## 2023-04-20 ENCOUNTER — Telehealth: Payer: Medicare PPO | Admitting: Adult Health

## 2023-04-20 DIAGNOSIS — R399 Unspecified symptoms and signs involving the genitourinary system: Secondary | ICD-10-CM | POA: Diagnosis not present

## 2023-04-27 DIAGNOSIS — L97329 Non-pressure chronic ulcer of left ankle with unspecified severity: Secondary | ICD-10-CM | POA: Diagnosis not present

## 2023-04-29 DIAGNOSIS — I10 Essential (primary) hypertension: Secondary | ICD-10-CM | POA: Diagnosis not present

## 2023-04-29 DIAGNOSIS — M810 Age-related osteoporosis without current pathological fracture: Secondary | ICD-10-CM | POA: Diagnosis not present

## 2023-04-29 DIAGNOSIS — F0390 Unspecified dementia without behavioral disturbance: Secondary | ICD-10-CM | POA: Diagnosis not present

## 2023-04-29 DIAGNOSIS — L89522 Pressure ulcer of left ankle, stage 2: Secondary | ICD-10-CM | POA: Diagnosis not present

## 2023-04-29 DIAGNOSIS — H3530 Unspecified macular degeneration: Secondary | ICD-10-CM | POA: Diagnosis not present

## 2023-05-03 DIAGNOSIS — L89522 Pressure ulcer of left ankle, stage 2: Secondary | ICD-10-CM | POA: Diagnosis not present

## 2023-05-03 DIAGNOSIS — I10 Essential (primary) hypertension: Secondary | ICD-10-CM | POA: Diagnosis not present

## 2023-05-03 DIAGNOSIS — H3530 Unspecified macular degeneration: Secondary | ICD-10-CM | POA: Diagnosis not present

## 2023-05-03 DIAGNOSIS — F0390 Unspecified dementia without behavioral disturbance: Secondary | ICD-10-CM | POA: Diagnosis not present

## 2023-05-03 DIAGNOSIS — M810 Age-related osteoporosis without current pathological fracture: Secondary | ICD-10-CM | POA: Diagnosis not present

## 2023-05-05 DIAGNOSIS — L97329 Non-pressure chronic ulcer of left ankle with unspecified severity: Secondary | ICD-10-CM | POA: Diagnosis not present

## 2023-05-06 DIAGNOSIS — M170 Bilateral primary osteoarthritis of knee: Secondary | ICD-10-CM | POA: Diagnosis not present

## 2023-05-06 DIAGNOSIS — F0390 Unspecified dementia without behavioral disturbance: Secondary | ICD-10-CM | POA: Diagnosis not present

## 2023-05-06 DIAGNOSIS — L89522 Pressure ulcer of left ankle, stage 2: Secondary | ICD-10-CM | POA: Diagnosis not present

## 2023-05-06 DIAGNOSIS — H3530 Unspecified macular degeneration: Secondary | ICD-10-CM | POA: Diagnosis not present

## 2023-05-06 DIAGNOSIS — M810 Age-related osteoporosis without current pathological fracture: Secondary | ICD-10-CM | POA: Diagnosis not present

## 2023-05-06 DIAGNOSIS — Z9181 History of falling: Secondary | ICD-10-CM | POA: Diagnosis not present

## 2023-05-06 DIAGNOSIS — M160 Bilateral primary osteoarthritis of hip: Secondary | ICD-10-CM | POA: Diagnosis not present

## 2023-05-06 DIAGNOSIS — I10 Essential (primary) hypertension: Secondary | ICD-10-CM | POA: Diagnosis not present

## 2023-05-07 ENCOUNTER — Encounter: Payer: Self-pay | Admitting: Internal Medicine

## 2023-05-07 ENCOUNTER — Ambulatory Visit (INDEPENDENT_AMBULATORY_CARE_PROVIDER_SITE_OTHER): Payer: Medicare PPO | Admitting: Internal Medicine

## 2023-05-07 ENCOUNTER — Other Ambulatory Visit: Payer: Self-pay

## 2023-05-07 VITALS — BP 126/76 | HR 62 | Temp 98.7°F | Resp 16

## 2023-05-07 DIAGNOSIS — G301 Alzheimer's disease with late onset: Secondary | ICD-10-CM

## 2023-05-07 DIAGNOSIS — F02C11 Dementia in other diseases classified elsewhere, severe, with agitation: Secondary | ICD-10-CM

## 2023-05-07 DIAGNOSIS — Z8709 Personal history of other diseases of the respiratory system: Secondary | ICD-10-CM | POA: Diagnosis not present

## 2023-05-07 DIAGNOSIS — L281 Prurigo nodularis: Secondary | ICD-10-CM | POA: Diagnosis not present

## 2023-05-07 NOTE — Progress Notes (Unsigned)
Follow Up Note  RE: Sandra Cruz MRN: [id number] DOB: Aug 29, 1929 Date of Office Visit: 05/07/2023  Referring provider: Farris Has, MD Primary care provider: Farris Has, MD  Chief Complaint: Follow-up and prurigo nodularis  History of Present Illness: I had the pleasure of seeing Sandra Cruz for a follow up visit at the [facility] on 05/09/2023. She is a 89 y.o. female, who is being followed for  Her previous allergy office visit was on 12/18/22 with Dr. Marlynn Perking. Today is a regular follow up visit.  History obtained from patient, chart review and  daughter . The patient, accompanied by her spouse, presents for a follow-up visit with a chief complaint of persistent itching episodes. The patient has a known history of dementia and has been experiencing these episodes for an extended period. The frequency of these episodes has reportedly decreased significantly since starting dupixent, contributing to an overall improvement in the patient's quality of life. She has less agitation, less bleeding open wounds and less nodules.   The patient also has a wound on her foot (pressure ulcer) which has necessitated the use of two antibiotics. The spouse reports that the patient has been sleepier than usual, possibly due to the antibiotics.    The patient's daughter has also implemented a double layer of clothing to prevent the patient's hand from scratching the affected areas, which has contributed to the improvement in the patient's skin condition.  The patient's spouse reports that the patient's tremors have significantly improved since starting this medication.  The patient's spouse reports that the patient has been maintaining a healthy diet and has been engaging in light physical activity, such as using a pedal machine. However, the patient has a wound on her foot, which has limited her ability to use the pedal machine. The wound, initially a pinprick, has worsened,  necessitating an x-ray to rule out bone involvement. Home health has been involved in the management of this wound.  The patient's  daughter has been using Vaseline and triple biotic ointment to manage the patient's skin condition. The patient's spouse has also been using Vanicream for the patient's bird baths, focusing on the patient's skin folds to prevent fungal infections. The patient's spouse has stopped using triamcinolone, a medication previously prescribed by a dermatologist, due to concerns of overuse.  The patient's spouse has been managing the patient's diet and exercise regimen, focusing on maintaining the patient's quality of life. The patient's spouse has expressed concerns about the patient's weight and has been considering ways to increase the patient's physical activity. However, the patient's spouse acknowledges the need to honor the patient's current state of health and to focus on maintaining the patient's comfort and happiness.  Assessment and Plan: Sandra Cruz is a 89 y.o. female with: Prurigo nodularis  History of COPD  Severe late onset Alzheimer's dementia with agitation (HCC)    Plan: Patient Instructions  Prugio Nodularis- histamine driven  Continue Allegra 180mg  twice daily  Continue 300mg  every 4 weeks  Continue gentle skin care   Follow up: 3 months   Thank you so much for letting me partake in your care today.  Don't hesitate to reach out if you have any additional concerns!  Ferol Luz, MD  [facility] [place] [place]   Daily Skin Care  - Recommend hypoallergenic hydrating ointment at least twice daily.  This must be done daily for control of flares. (Great options include Vaseline, CeraVe, Aquaphor, Aveeno, Cetaphil, VaniCream, etc) -  Recommend avoiding detergents, soaps or lotions with fragrances/dyes, and instead using products which are hypoallergenic, use second rinse cycle when washing clothes -Wear lose breathable clothing, avoid  wool -Avoid extremes of humidity    No orders of the defined types were placed in this encounter.   Lab Orders  No laboratory test(s) ordered today   Diagnostics: None done  Medication List:  Current Outpatient Medications  Medication Sig Dispense Refill   amLODipine (NORVASC) 2.5 MG tablet Take 2.5 mg by mouth 2 (two) times a day.     carvedilol (COREG) 3.125 MG tablet Take 3.125 mg by mouth 2 (two) times a day.     clotrimazole-betamethasone (LOTRISONE) cream Apply 1 application  topically 2 (two) times daily. As needed     CRANBERRY PO Take by mouth. gummies     Docusate Sodium (COLACE PO) Take 3 tablets by mouth daily.     donepezil (ARICEPT) 10 MG tablet Take 1 tablet (10 mg total) by mouth every evening. 90 tablet 3   dupilumab (DUPIXENT) 300 MG/2ML prefilled syringe Inject 300 mg into the skin every 14 (fourteen) days. 4 mL 11   Ferrous Sulfate (IRON PO) Take 65 mg by mouth daily.     MAGNESIUM CITRATE PO Take by mouth.     melatonin 3 MG TABS tablet Take 1.5 mg by mouth at bedtime.     memantine (NAMENDA) 10 MG tablet Take 1 tablet (10 mg total) by mouth 2 (two) times daily. 180 tablet 3   Multiple Vitamin (MULTIVITAMIN PO) 1 tablet at noon. Brand: Life extension-High Potency Multivitamin and Mineral Supplement     nystatin cream (MYCOSTATIN) Apply 1 Application topically 2 (two) times daily as needed.     Omega-3 Fatty Acids (FISH OIL) 500 MG CAPS Take 1 each by mouth daily at 12 noon.     OVER THE COUNTER MEDICATION Calcium     Probiotic Product (PROBIOTIC PO) Take 1 tablet by mouth daily at 12 noon. 15 strains/25 billion microorganisms     senna (SENOKOT) 8.6 MG TABS tablet Take 3 tablets by mouth as needed for mild constipation.     sodium chloride 1 g tablet Take 1 g by mouth daily.     triamcinolone cream (KENALOG) 0.1 % Apply 1 Application topically as needed.     No current facility-administered medications for this visit.   Allergies: Allergies  Allergen  Reactions   Codeine Shortness Of Breath   Penicillins Swelling and Rash    Did it involve swelling of the face/tongue/throat, SOB, or low BP? No Did it involve sudden or severe rash/hives, skin peeling, or any reaction on the inside of your mouth or nose? No Did you need to seek medical attention at a hospital or doctor's office? No When did it last happen?      childhood If all above answers are "NO", may proceed with cephalosporin use.   Shellfish Allergy Shortness Of Breath   Latex Other (See Comments)    blisters   I reviewed her past medical history, social history, family history, and environmental history and no significant changes have been reported from her previous visit.  ROS: All others negative except as noted per HPI.   Objective: BP 126/76 (BP Location: Left Arm, Patient Position: Sitting, Cuff Size: Large)   Pulse 62   Temp 98.7 F (37.1 C) (Temporal)   Resp 16   SpO2 96%  There is no height or weight on file to calculate BMI. General Appearance:  Alert,  cooperative, no distress, appears stated age, in wheelchair   Head:  Normocephalic, without obvious abnormality, atraumatic  Eyes:  Conjunctiva clear, EOM's intact  Nose: Nares normal,   Throat: Lips, tongue normal; teeth and gums normal,   Neck: Supple, symmetrical  Lungs:   , Respirations unlabored, no coughing  Heart:  , Appears well perfused  Extremities: No edema  Skin: Skin color, texture, turgor normal,  hyperpigmented nodules on trunk- improved from prior  Neurologic: No gross deficits   Previous notes and tests were reviewed. The plan was reviewed with the patient/family, and all questions/concerned were addressed.  It was my pleasure to see Sandra Cruz today and participate in her care. Please feel free to contact me with any questions or concerns.  Sincerely,  Ferol Luz, MD  Allergy & Immunology  [facility] Office: [phone]

## 2023-05-07 NOTE — Patient Instructions (Signed)
Prugio Nodularis- histamine driven  Continue Allegra 180mg  twice daily  Continue 300mg  every 4 weeks  Continue gentle skin care   Follow up: 3 months   Thank you so much for letting me partake in your care today.  Don't hesitate to reach out if you have any additional concerns!  Ferol Luz, MD  [facility] [place] [place]   Daily Skin Care  - Recommend hypoallergenic hydrating ointment at least twice daily.  This must be done daily for control of flares. (Great options include Vaseline, CeraVe, Aquaphor, Aveeno, Cetaphil, VaniCream, etc) - Recommend avoiding detergents, soaps or lotions with fragrances/dyes, and instead using products which are hypoallergenic, use second rinse cycle when washing clothes -Wear lose breathable clothing, avoid wool -Avoid extremes of humidity

## 2023-05-08 DIAGNOSIS — L89522 Pressure ulcer of left ankle, stage 2: Secondary | ICD-10-CM | POA: Diagnosis not present

## 2023-05-08 DIAGNOSIS — I10 Essential (primary) hypertension: Secondary | ICD-10-CM | POA: Diagnosis not present

## 2023-05-08 DIAGNOSIS — F0390 Unspecified dementia without behavioral disturbance: Secondary | ICD-10-CM | POA: Diagnosis not present

## 2023-05-08 DIAGNOSIS — H3530 Unspecified macular degeneration: Secondary | ICD-10-CM | POA: Diagnosis not present

## 2023-05-08 DIAGNOSIS — M810 Age-related osteoporosis without current pathological fracture: Secondary | ICD-10-CM | POA: Diagnosis not present

## 2023-05-10 DIAGNOSIS — H3530 Unspecified macular degeneration: Secondary | ICD-10-CM | POA: Diagnosis not present

## 2023-05-10 DIAGNOSIS — F0390 Unspecified dementia without behavioral disturbance: Secondary | ICD-10-CM | POA: Diagnosis not present

## 2023-05-10 DIAGNOSIS — I10 Essential (primary) hypertension: Secondary | ICD-10-CM | POA: Diagnosis not present

## 2023-05-10 DIAGNOSIS — L89522 Pressure ulcer of left ankle, stage 2: Secondary | ICD-10-CM | POA: Diagnosis not present

## 2023-05-10 DIAGNOSIS — M810 Age-related osteoporosis without current pathological fracture: Secondary | ICD-10-CM | POA: Diagnosis not present

## 2023-05-13 DIAGNOSIS — M810 Age-related osteoporosis without current pathological fracture: Secondary | ICD-10-CM | POA: Diagnosis not present

## 2023-05-13 DIAGNOSIS — H3530 Unspecified macular degeneration: Secondary | ICD-10-CM | POA: Diagnosis not present

## 2023-05-13 DIAGNOSIS — I10 Essential (primary) hypertension: Secondary | ICD-10-CM | POA: Diagnosis not present

## 2023-05-13 DIAGNOSIS — L89522 Pressure ulcer of left ankle, stage 2: Secondary | ICD-10-CM | POA: Diagnosis not present

## 2023-05-13 DIAGNOSIS — F0390 Unspecified dementia without behavioral disturbance: Secondary | ICD-10-CM | POA: Diagnosis not present

## 2023-05-15 DIAGNOSIS — M810 Age-related osteoporosis without current pathological fracture: Secondary | ICD-10-CM | POA: Diagnosis not present

## 2023-05-15 DIAGNOSIS — L89522 Pressure ulcer of left ankle, stage 2: Secondary | ICD-10-CM | POA: Diagnosis not present

## 2023-05-15 DIAGNOSIS — F0390 Unspecified dementia without behavioral disturbance: Secondary | ICD-10-CM | POA: Diagnosis not present

## 2023-05-15 DIAGNOSIS — I10 Essential (primary) hypertension: Secondary | ICD-10-CM | POA: Diagnosis not present

## 2023-05-15 DIAGNOSIS — H3530 Unspecified macular degeneration: Secondary | ICD-10-CM | POA: Diagnosis not present

## 2023-05-17 DIAGNOSIS — I10 Essential (primary) hypertension: Secondary | ICD-10-CM | POA: Diagnosis not present

## 2023-05-17 DIAGNOSIS — F0390 Unspecified dementia without behavioral disturbance: Secondary | ICD-10-CM | POA: Diagnosis not present

## 2023-05-17 DIAGNOSIS — M810 Age-related osteoporosis without current pathological fracture: Secondary | ICD-10-CM | POA: Diagnosis not present

## 2023-05-17 DIAGNOSIS — L89522 Pressure ulcer of left ankle, stage 2: Secondary | ICD-10-CM | POA: Diagnosis not present

## 2023-05-17 DIAGNOSIS — H3530 Unspecified macular degeneration: Secondary | ICD-10-CM | POA: Diagnosis not present

## 2023-05-19 ENCOUNTER — Other Ambulatory Visit: Payer: Self-pay | Admitting: *Deleted

## 2023-05-19 MED ORDER — DUPIXENT 300 MG/2ML ~~LOC~~ SOSY: 300.0000 mg | PREFILLED_SYRINGE | SUBCUTANEOUS | 11 refills | Status: DC

## 2023-05-20 DIAGNOSIS — H3530 Unspecified macular degeneration: Secondary | ICD-10-CM | POA: Diagnosis not present

## 2023-05-20 DIAGNOSIS — I10 Essential (primary) hypertension: Secondary | ICD-10-CM | POA: Diagnosis not present

## 2023-05-20 DIAGNOSIS — F0390 Unspecified dementia without behavioral disturbance: Secondary | ICD-10-CM | POA: Diagnosis not present

## 2023-05-20 DIAGNOSIS — L89522 Pressure ulcer of left ankle, stage 2: Secondary | ICD-10-CM | POA: Diagnosis not present

## 2023-05-20 DIAGNOSIS — M810 Age-related osteoporosis without current pathological fracture: Secondary | ICD-10-CM | POA: Diagnosis not present

## 2023-05-22 DIAGNOSIS — F0390 Unspecified dementia without behavioral disturbance: Secondary | ICD-10-CM | POA: Diagnosis not present

## 2023-05-22 DIAGNOSIS — H3530 Unspecified macular degeneration: Secondary | ICD-10-CM | POA: Diagnosis not present

## 2023-05-22 DIAGNOSIS — I10 Essential (primary) hypertension: Secondary | ICD-10-CM | POA: Diagnosis not present

## 2023-05-22 DIAGNOSIS — L89522 Pressure ulcer of left ankle, stage 2: Secondary | ICD-10-CM | POA: Diagnosis not present

## 2023-05-22 DIAGNOSIS — M810 Age-related osteoporosis without current pathological fracture: Secondary | ICD-10-CM | POA: Diagnosis not present

## 2023-05-25 DIAGNOSIS — H3530 Unspecified macular degeneration: Secondary | ICD-10-CM | POA: Diagnosis not present

## 2023-05-25 DIAGNOSIS — F0390 Unspecified dementia without behavioral disturbance: Secondary | ICD-10-CM | POA: Diagnosis not present

## 2023-05-25 DIAGNOSIS — M810 Age-related osteoporosis without current pathological fracture: Secondary | ICD-10-CM | POA: Diagnosis not present

## 2023-05-25 DIAGNOSIS — L89522 Pressure ulcer of left ankle, stage 2: Secondary | ICD-10-CM | POA: Diagnosis not present

## 2023-05-25 DIAGNOSIS — I10 Essential (primary) hypertension: Secondary | ICD-10-CM | POA: Diagnosis not present

## 2023-05-27 DIAGNOSIS — L89522 Pressure ulcer of left ankle, stage 2: Secondary | ICD-10-CM | POA: Diagnosis not present

## 2023-05-27 DIAGNOSIS — M810 Age-related osteoporosis without current pathological fracture: Secondary | ICD-10-CM | POA: Diagnosis not present

## 2023-05-27 DIAGNOSIS — I10 Essential (primary) hypertension: Secondary | ICD-10-CM | POA: Diagnosis not present

## 2023-05-27 DIAGNOSIS — H3530 Unspecified macular degeneration: Secondary | ICD-10-CM | POA: Diagnosis not present

## 2023-05-27 DIAGNOSIS — L97329 Non-pressure chronic ulcer of left ankle with unspecified severity: Secondary | ICD-10-CM | POA: Diagnosis not present

## 2023-05-27 DIAGNOSIS — F0390 Unspecified dementia without behavioral disturbance: Secondary | ICD-10-CM | POA: Diagnosis not present

## 2023-05-29 DIAGNOSIS — I10 Essential (primary) hypertension: Secondary | ICD-10-CM | POA: Diagnosis not present

## 2023-05-29 DIAGNOSIS — H3530 Unspecified macular degeneration: Secondary | ICD-10-CM | POA: Diagnosis not present

## 2023-05-29 DIAGNOSIS — M810 Age-related osteoporosis without current pathological fracture: Secondary | ICD-10-CM | POA: Diagnosis not present

## 2023-05-29 DIAGNOSIS — L89522 Pressure ulcer of left ankle, stage 2: Secondary | ICD-10-CM | POA: Diagnosis not present

## 2023-05-29 DIAGNOSIS — F0390 Unspecified dementia without behavioral disturbance: Secondary | ICD-10-CM | POA: Diagnosis not present

## 2023-05-31 DIAGNOSIS — L89522 Pressure ulcer of left ankle, stage 2: Secondary | ICD-10-CM | POA: Diagnosis not present

## 2023-05-31 DIAGNOSIS — F0390 Unspecified dementia without behavioral disturbance: Secondary | ICD-10-CM | POA: Diagnosis not present

## 2023-05-31 DIAGNOSIS — M810 Age-related osteoporosis without current pathological fracture: Secondary | ICD-10-CM | POA: Diagnosis not present

## 2023-05-31 DIAGNOSIS — H3530 Unspecified macular degeneration: Secondary | ICD-10-CM | POA: Diagnosis not present

## 2023-05-31 DIAGNOSIS — I10 Essential (primary) hypertension: Secondary | ICD-10-CM | POA: Diagnosis not present

## 2023-06-01 ENCOUNTER — Encounter: Payer: Self-pay | Admitting: Internal Medicine

## 2023-06-02 DIAGNOSIS — L89522 Pressure ulcer of left ankle, stage 2: Secondary | ICD-10-CM | POA: Diagnosis not present

## 2023-06-02 DIAGNOSIS — H3530 Unspecified macular degeneration: Secondary | ICD-10-CM | POA: Diagnosis not present

## 2023-06-02 DIAGNOSIS — I10 Essential (primary) hypertension: Secondary | ICD-10-CM | POA: Diagnosis not present

## 2023-06-02 DIAGNOSIS — F0390 Unspecified dementia without behavioral disturbance: Secondary | ICD-10-CM | POA: Diagnosis not present

## 2023-06-02 DIAGNOSIS — M810 Age-related osteoporosis without current pathological fracture: Secondary | ICD-10-CM | POA: Diagnosis not present

## 2023-06-04 DIAGNOSIS — L89522 Pressure ulcer of left ankle, stage 2: Secondary | ICD-10-CM | POA: Diagnosis not present

## 2023-06-04 DIAGNOSIS — F0390 Unspecified dementia without behavioral disturbance: Secondary | ICD-10-CM | POA: Diagnosis not present

## 2023-06-04 DIAGNOSIS — I10 Essential (primary) hypertension: Secondary | ICD-10-CM | POA: Diagnosis not present

## 2023-06-04 DIAGNOSIS — H3530 Unspecified macular degeneration: Secondary | ICD-10-CM | POA: Diagnosis not present

## 2023-06-04 DIAGNOSIS — M810 Age-related osteoporosis without current pathological fracture: Secondary | ICD-10-CM | POA: Diagnosis not present

## 2023-06-07 DIAGNOSIS — M810 Age-related osteoporosis without current pathological fracture: Secondary | ICD-10-CM | POA: Diagnosis not present

## 2023-06-07 DIAGNOSIS — I10 Essential (primary) hypertension: Secondary | ICD-10-CM | POA: Diagnosis not present

## 2023-06-07 DIAGNOSIS — F0390 Unspecified dementia without behavioral disturbance: Secondary | ICD-10-CM | POA: Diagnosis not present

## 2023-06-07 DIAGNOSIS — L89522 Pressure ulcer of left ankle, stage 2: Secondary | ICD-10-CM | POA: Diagnosis not present

## 2023-06-07 DIAGNOSIS — H3530 Unspecified macular degeneration: Secondary | ICD-10-CM | POA: Diagnosis not present

## 2023-06-09 DIAGNOSIS — F0390 Unspecified dementia without behavioral disturbance: Secondary | ICD-10-CM | POA: Diagnosis not present

## 2023-06-09 DIAGNOSIS — H3530 Unspecified macular degeneration: Secondary | ICD-10-CM | POA: Diagnosis not present

## 2023-06-09 DIAGNOSIS — M810 Age-related osteoporosis without current pathological fracture: Secondary | ICD-10-CM | POA: Diagnosis not present

## 2023-06-09 DIAGNOSIS — L89522 Pressure ulcer of left ankle, stage 2: Secondary | ICD-10-CM | POA: Diagnosis not present

## 2023-06-09 DIAGNOSIS — I10 Essential (primary) hypertension: Secondary | ICD-10-CM | POA: Diagnosis not present

## 2023-06-10 DIAGNOSIS — L97329 Non-pressure chronic ulcer of left ankle with unspecified severity: Secondary | ICD-10-CM | POA: Diagnosis not present

## 2023-06-11 DIAGNOSIS — I10 Essential (primary) hypertension: Secondary | ICD-10-CM | POA: Diagnosis not present

## 2023-06-11 DIAGNOSIS — F0390 Unspecified dementia without behavioral disturbance: Secondary | ICD-10-CM | POA: Diagnosis not present

## 2023-06-11 DIAGNOSIS — H3530 Unspecified macular degeneration: Secondary | ICD-10-CM | POA: Diagnosis not present

## 2023-06-11 DIAGNOSIS — L89522 Pressure ulcer of left ankle, stage 2: Secondary | ICD-10-CM | POA: Diagnosis not present

## 2023-06-11 DIAGNOSIS — M810 Age-related osteoporosis without current pathological fracture: Secondary | ICD-10-CM | POA: Diagnosis not present

## 2023-06-14 DIAGNOSIS — M810 Age-related osteoporosis without current pathological fracture: Secondary | ICD-10-CM | POA: Diagnosis not present

## 2023-06-14 DIAGNOSIS — F0390 Unspecified dementia without behavioral disturbance: Secondary | ICD-10-CM | POA: Diagnosis not present

## 2023-06-14 DIAGNOSIS — L89522 Pressure ulcer of left ankle, stage 2: Secondary | ICD-10-CM | POA: Diagnosis not present

## 2023-06-14 DIAGNOSIS — H3530 Unspecified macular degeneration: Secondary | ICD-10-CM | POA: Diagnosis not present

## 2023-06-14 DIAGNOSIS — I10 Essential (primary) hypertension: Secondary | ICD-10-CM | POA: Diagnosis not present

## 2023-06-16 DIAGNOSIS — F0390 Unspecified dementia without behavioral disturbance: Secondary | ICD-10-CM | POA: Diagnosis not present

## 2023-06-16 DIAGNOSIS — H3530 Unspecified macular degeneration: Secondary | ICD-10-CM | POA: Diagnosis not present

## 2023-06-16 DIAGNOSIS — M810 Age-related osteoporosis without current pathological fracture: Secondary | ICD-10-CM | POA: Diagnosis not present

## 2023-06-16 DIAGNOSIS — I10 Essential (primary) hypertension: Secondary | ICD-10-CM | POA: Diagnosis not present

## 2023-06-16 DIAGNOSIS — L89522 Pressure ulcer of left ankle, stage 2: Secondary | ICD-10-CM | POA: Diagnosis not present

## 2023-06-18 DIAGNOSIS — H3530 Unspecified macular degeneration: Secondary | ICD-10-CM | POA: Diagnosis not present

## 2023-06-18 DIAGNOSIS — L89522 Pressure ulcer of left ankle, stage 2: Secondary | ICD-10-CM | POA: Diagnosis not present

## 2023-06-18 DIAGNOSIS — M810 Age-related osteoporosis without current pathological fracture: Secondary | ICD-10-CM | POA: Diagnosis not present

## 2023-06-18 DIAGNOSIS — I10 Essential (primary) hypertension: Secondary | ICD-10-CM | POA: Diagnosis not present

## 2023-06-18 DIAGNOSIS — F0390 Unspecified dementia without behavioral disturbance: Secondary | ICD-10-CM | POA: Diagnosis not present

## 2023-06-20 ENCOUNTER — Encounter: Payer: Self-pay | Admitting: Internal Medicine

## 2023-06-21 DIAGNOSIS — F0390 Unspecified dementia without behavioral disturbance: Secondary | ICD-10-CM | POA: Diagnosis not present

## 2023-06-21 DIAGNOSIS — I10 Essential (primary) hypertension: Secondary | ICD-10-CM | POA: Diagnosis not present

## 2023-06-21 DIAGNOSIS — L89522 Pressure ulcer of left ankle, stage 2: Secondary | ICD-10-CM | POA: Diagnosis not present

## 2023-06-21 DIAGNOSIS — M810 Age-related osteoporosis without current pathological fracture: Secondary | ICD-10-CM | POA: Diagnosis not present

## 2023-06-21 DIAGNOSIS — H3530 Unspecified macular degeneration: Secondary | ICD-10-CM | POA: Diagnosis not present

## 2023-06-23 DIAGNOSIS — L89522 Pressure ulcer of left ankle, stage 2: Secondary | ICD-10-CM | POA: Diagnosis not present

## 2023-06-23 DIAGNOSIS — M810 Age-related osteoporosis without current pathological fracture: Secondary | ICD-10-CM | POA: Diagnosis not present

## 2023-06-23 DIAGNOSIS — I10 Essential (primary) hypertension: Secondary | ICD-10-CM | POA: Diagnosis not present

## 2023-06-23 DIAGNOSIS — H3530 Unspecified macular degeneration: Secondary | ICD-10-CM | POA: Diagnosis not present

## 2023-06-23 DIAGNOSIS — F0390 Unspecified dementia without behavioral disturbance: Secondary | ICD-10-CM | POA: Diagnosis not present

## 2023-06-24 NOTE — Telephone Encounter (Signed)
 Patients daughter came by and picked up the sample Dupixent . Date of birth and name confirmed.

## 2023-06-25 DIAGNOSIS — L89522 Pressure ulcer of left ankle, stage 2: Secondary | ICD-10-CM | POA: Diagnosis not present

## 2023-06-25 DIAGNOSIS — F0390 Unspecified dementia without behavioral disturbance: Secondary | ICD-10-CM | POA: Diagnosis not present

## 2023-06-25 DIAGNOSIS — M810 Age-related osteoporosis without current pathological fracture: Secondary | ICD-10-CM | POA: Diagnosis not present

## 2023-06-25 DIAGNOSIS — I10 Essential (primary) hypertension: Secondary | ICD-10-CM | POA: Diagnosis not present

## 2023-06-25 DIAGNOSIS — H3530 Unspecified macular degeneration: Secondary | ICD-10-CM | POA: Diagnosis not present

## 2023-06-28 DIAGNOSIS — M810 Age-related osteoporosis without current pathological fracture: Secondary | ICD-10-CM | POA: Diagnosis not present

## 2023-06-28 DIAGNOSIS — I10 Essential (primary) hypertension: Secondary | ICD-10-CM | POA: Diagnosis not present

## 2023-06-28 DIAGNOSIS — L89522 Pressure ulcer of left ankle, stage 2: Secondary | ICD-10-CM | POA: Diagnosis not present

## 2023-06-28 DIAGNOSIS — F0390 Unspecified dementia without behavioral disturbance: Secondary | ICD-10-CM | POA: Diagnosis not present

## 2023-06-28 DIAGNOSIS — H3530 Unspecified macular degeneration: Secondary | ICD-10-CM | POA: Diagnosis not present

## 2023-06-29 DIAGNOSIS — L97312 Non-pressure chronic ulcer of right ankle with fat layer exposed: Secondary | ICD-10-CM | POA: Diagnosis not present

## 2023-06-29 DIAGNOSIS — M79671 Pain in right foot: Secondary | ICD-10-CM | POA: Diagnosis not present

## 2023-06-30 DIAGNOSIS — L97329 Non-pressure chronic ulcer of left ankle with unspecified severity: Secondary | ICD-10-CM | POA: Diagnosis not present

## 2023-06-30 DIAGNOSIS — F0390 Unspecified dementia without behavioral disturbance: Secondary | ICD-10-CM | POA: Diagnosis not present

## 2023-06-30 DIAGNOSIS — L89522 Pressure ulcer of left ankle, stage 2: Secondary | ICD-10-CM | POA: Diagnosis not present

## 2023-06-30 DIAGNOSIS — M810 Age-related osteoporosis without current pathological fracture: Secondary | ICD-10-CM | POA: Diagnosis not present

## 2023-06-30 DIAGNOSIS — H3530 Unspecified macular degeneration: Secondary | ICD-10-CM | POA: Diagnosis not present

## 2023-06-30 DIAGNOSIS — I10 Essential (primary) hypertension: Secondary | ICD-10-CM | POA: Diagnosis not present

## 2023-07-02 DIAGNOSIS — F0390 Unspecified dementia without behavioral disturbance: Secondary | ICD-10-CM | POA: Diagnosis not present

## 2023-07-02 DIAGNOSIS — E785 Hyperlipidemia, unspecified: Secondary | ICD-10-CM | POA: Diagnosis not present

## 2023-07-02 DIAGNOSIS — L89522 Pressure ulcer of left ankle, stage 2: Secondary | ICD-10-CM | POA: Diagnosis not present

## 2023-07-02 DIAGNOSIS — R8290 Unspecified abnormal findings in urine: Secondary | ICD-10-CM | POA: Diagnosis not present

## 2023-07-02 DIAGNOSIS — S91309A Unspecified open wound, unspecified foot, initial encounter: Secondary | ICD-10-CM | POA: Diagnosis not present

## 2023-07-02 DIAGNOSIS — H3530 Unspecified macular degeneration: Secondary | ICD-10-CM | POA: Diagnosis not present

## 2023-07-02 DIAGNOSIS — I10 Essential (primary) hypertension: Secondary | ICD-10-CM | POA: Diagnosis not present

## 2023-07-02 DIAGNOSIS — M810 Age-related osteoporosis without current pathological fracture: Secondary | ICD-10-CM | POA: Diagnosis not present

## 2023-07-02 DIAGNOSIS — Z0000 Encounter for general adult medical examination without abnormal findings: Secondary | ICD-10-CM | POA: Diagnosis not present

## 2023-07-02 DIAGNOSIS — M179 Osteoarthritis of knee, unspecified: Secondary | ICD-10-CM | POA: Diagnosis not present

## 2023-07-05 DIAGNOSIS — M810 Age-related osteoporosis without current pathological fracture: Secondary | ICD-10-CM | POA: Diagnosis not present

## 2023-07-05 DIAGNOSIS — I10 Essential (primary) hypertension: Secondary | ICD-10-CM | POA: Diagnosis not present

## 2023-07-05 DIAGNOSIS — H3530 Unspecified macular degeneration: Secondary | ICD-10-CM | POA: Diagnosis not present

## 2023-07-05 DIAGNOSIS — F0390 Unspecified dementia without behavioral disturbance: Secondary | ICD-10-CM | POA: Diagnosis not present

## 2023-07-05 DIAGNOSIS — L89522 Pressure ulcer of left ankle, stage 2: Secondary | ICD-10-CM | POA: Diagnosis not present

## 2023-07-07 DIAGNOSIS — F0390 Unspecified dementia without behavioral disturbance: Secondary | ICD-10-CM | POA: Diagnosis not present

## 2023-07-07 DIAGNOSIS — M810 Age-related osteoporosis without current pathological fracture: Secondary | ICD-10-CM | POA: Diagnosis not present

## 2023-07-07 DIAGNOSIS — H3530 Unspecified macular degeneration: Secondary | ICD-10-CM | POA: Diagnosis not present

## 2023-07-07 DIAGNOSIS — L89522 Pressure ulcer of left ankle, stage 2: Secondary | ICD-10-CM | POA: Diagnosis not present

## 2023-07-07 DIAGNOSIS — I10 Essential (primary) hypertension: Secondary | ICD-10-CM | POA: Diagnosis not present

## 2023-07-09 DIAGNOSIS — M810 Age-related osteoporosis without current pathological fracture: Secondary | ICD-10-CM | POA: Diagnosis not present

## 2023-07-09 DIAGNOSIS — L89522 Pressure ulcer of left ankle, stage 2: Secondary | ICD-10-CM | POA: Diagnosis not present

## 2023-07-09 DIAGNOSIS — F0390 Unspecified dementia without behavioral disturbance: Secondary | ICD-10-CM | POA: Diagnosis not present

## 2023-07-09 DIAGNOSIS — I10 Essential (primary) hypertension: Secondary | ICD-10-CM | POA: Diagnosis not present

## 2023-07-09 DIAGNOSIS — H3530 Unspecified macular degeneration: Secondary | ICD-10-CM | POA: Diagnosis not present

## 2023-07-12 DIAGNOSIS — I10 Essential (primary) hypertension: Secondary | ICD-10-CM | POA: Diagnosis not present

## 2023-07-12 DIAGNOSIS — M810 Age-related osteoporosis without current pathological fracture: Secondary | ICD-10-CM | POA: Diagnosis not present

## 2023-07-12 DIAGNOSIS — F0390 Unspecified dementia without behavioral disturbance: Secondary | ICD-10-CM | POA: Diagnosis not present

## 2023-07-12 DIAGNOSIS — H3530 Unspecified macular degeneration: Secondary | ICD-10-CM | POA: Diagnosis not present

## 2023-07-12 DIAGNOSIS — L89522 Pressure ulcer of left ankle, stage 2: Secondary | ICD-10-CM | POA: Diagnosis not present

## 2023-07-14 DIAGNOSIS — M810 Age-related osteoporosis without current pathological fracture: Secondary | ICD-10-CM | POA: Diagnosis not present

## 2023-07-14 DIAGNOSIS — I10 Essential (primary) hypertension: Secondary | ICD-10-CM | POA: Diagnosis not present

## 2023-07-14 DIAGNOSIS — L89522 Pressure ulcer of left ankle, stage 2: Secondary | ICD-10-CM | POA: Diagnosis not present

## 2023-07-14 DIAGNOSIS — F0390 Unspecified dementia without behavioral disturbance: Secondary | ICD-10-CM | POA: Diagnosis not present

## 2023-07-14 DIAGNOSIS — H3530 Unspecified macular degeneration: Secondary | ICD-10-CM | POA: Diagnosis not present

## 2023-07-16 DIAGNOSIS — F0390 Unspecified dementia without behavioral disturbance: Secondary | ICD-10-CM | POA: Diagnosis not present

## 2023-07-16 DIAGNOSIS — H3530 Unspecified macular degeneration: Secondary | ICD-10-CM | POA: Diagnosis not present

## 2023-07-16 DIAGNOSIS — L89522 Pressure ulcer of left ankle, stage 2: Secondary | ICD-10-CM | POA: Diagnosis not present

## 2023-07-16 DIAGNOSIS — M810 Age-related osteoporosis without current pathological fracture: Secondary | ICD-10-CM | POA: Diagnosis not present

## 2023-07-16 DIAGNOSIS — I10 Essential (primary) hypertension: Secondary | ICD-10-CM | POA: Diagnosis not present

## 2023-07-19 DIAGNOSIS — L89522 Pressure ulcer of left ankle, stage 2: Secondary | ICD-10-CM | POA: Diagnosis not present

## 2023-07-19 DIAGNOSIS — I10 Essential (primary) hypertension: Secondary | ICD-10-CM | POA: Diagnosis not present

## 2023-07-19 DIAGNOSIS — H3530 Unspecified macular degeneration: Secondary | ICD-10-CM | POA: Diagnosis not present

## 2023-07-19 DIAGNOSIS — M810 Age-related osteoporosis without current pathological fracture: Secondary | ICD-10-CM | POA: Diagnosis not present

## 2023-07-19 DIAGNOSIS — F0390 Unspecified dementia without behavioral disturbance: Secondary | ICD-10-CM | POA: Diagnosis not present

## 2023-07-21 DIAGNOSIS — F0390 Unspecified dementia without behavioral disturbance: Secondary | ICD-10-CM | POA: Diagnosis not present

## 2023-07-21 DIAGNOSIS — M810 Age-related osteoporosis without current pathological fracture: Secondary | ICD-10-CM | POA: Diagnosis not present

## 2023-07-21 DIAGNOSIS — H3530 Unspecified macular degeneration: Secondary | ICD-10-CM | POA: Diagnosis not present

## 2023-07-21 DIAGNOSIS — L89522 Pressure ulcer of left ankle, stage 2: Secondary | ICD-10-CM | POA: Diagnosis not present

## 2023-07-21 DIAGNOSIS — I10 Essential (primary) hypertension: Secondary | ICD-10-CM | POA: Diagnosis not present

## 2023-07-23 DIAGNOSIS — F0390 Unspecified dementia without behavioral disturbance: Secondary | ICD-10-CM | POA: Diagnosis not present

## 2023-07-23 DIAGNOSIS — H3530 Unspecified macular degeneration: Secondary | ICD-10-CM | POA: Diagnosis not present

## 2023-07-23 DIAGNOSIS — I10 Essential (primary) hypertension: Secondary | ICD-10-CM | POA: Diagnosis not present

## 2023-07-23 DIAGNOSIS — M810 Age-related osteoporosis without current pathological fracture: Secondary | ICD-10-CM | POA: Diagnosis not present

## 2023-07-23 DIAGNOSIS — L89522 Pressure ulcer of left ankle, stage 2: Secondary | ICD-10-CM | POA: Diagnosis not present

## 2023-07-25 ENCOUNTER — Encounter: Payer: Self-pay | Admitting: Neurology

## 2023-07-26 ENCOUNTER — Encounter (HOSPITAL_BASED_OUTPATIENT_CLINIC_OR_DEPARTMENT_OTHER): Payer: Medicare PPO | Attending: Internal Medicine | Admitting: Internal Medicine

## 2023-07-26 DIAGNOSIS — L89896 Pressure-induced deep tissue damage of other site: Secondary | ICD-10-CM | POA: Diagnosis not present

## 2023-07-26 DIAGNOSIS — I70243 Atherosclerosis of native arteries of left leg with ulceration of ankle: Secondary | ICD-10-CM | POA: Insufficient documentation

## 2023-07-26 DIAGNOSIS — L89520 Pressure ulcer of left ankle, unstageable: Secondary | ICD-10-CM | POA: Diagnosis not present

## 2023-07-26 DIAGNOSIS — F03C4 Unspecified dementia, severe, with anxiety: Secondary | ICD-10-CM | POA: Diagnosis not present

## 2023-07-27 ENCOUNTER — Telehealth (HOSPITAL_COMMUNITY): Payer: Self-pay

## 2023-07-27 DIAGNOSIS — L97329 Non-pressure chronic ulcer of left ankle with unspecified severity: Secondary | ICD-10-CM | POA: Diagnosis not present

## 2023-07-27 NOTE — Telephone Encounter (Signed)
 Attempted to contact the patient to schedule VAS [place].  Yes answer. Contact person hung up the phone while I was attempting to introduce my self. Called, again, straight to voicemail. Left message.  First Attempt. Provided direct contact number for scheduling: [phone].

## 2023-07-28 DIAGNOSIS — L89522 Pressure ulcer of left ankle, stage 2: Secondary | ICD-10-CM | POA: Diagnosis not present

## 2023-07-28 DIAGNOSIS — F0390 Unspecified dementia without behavioral disturbance: Secondary | ICD-10-CM | POA: Diagnosis not present

## 2023-07-28 DIAGNOSIS — I10 Essential (primary) hypertension: Secondary | ICD-10-CM | POA: Diagnosis not present

## 2023-07-28 DIAGNOSIS — M810 Age-related osteoporosis without current pathological fracture: Secondary | ICD-10-CM | POA: Diagnosis not present

## 2023-07-28 DIAGNOSIS — H3530 Unspecified macular degeneration: Secondary | ICD-10-CM | POA: Diagnosis not present

## 2023-07-29 NOTE — Telephone Encounter (Signed)
 Called pt's daughter and LVM (ok per DPR) with mychart message information I had sent already. I offered to schedule patient for visit as early as tomorrow AM or next Monday 3/17. Asked for call back asap to schedule patient. Left number for return call.

## 2023-07-30 ENCOUNTER — Telehealth (HOSPITAL_COMMUNITY): Payer: Self-pay

## 2023-07-30 DIAGNOSIS — I10 Essential (primary) hypertension: Secondary | ICD-10-CM | POA: Diagnosis not present

## 2023-07-30 DIAGNOSIS — M810 Age-related osteoporosis without current pathological fracture: Secondary | ICD-10-CM | POA: Diagnosis not present

## 2023-07-30 DIAGNOSIS — H3530 Unspecified macular degeneration: Secondary | ICD-10-CM | POA: Diagnosis not present

## 2023-07-30 DIAGNOSIS — L89522 Pressure ulcer of left ankle, stage 2: Secondary | ICD-10-CM | POA: Diagnosis not present

## 2023-07-30 DIAGNOSIS — F0390 Unspecified dementia without behavioral disturbance: Secondary | ICD-10-CM | POA: Diagnosis not present

## 2023-07-30 NOTE — Telephone Encounter (Signed)
 Olegario Messier called back, limited availability. Not able to schedule at this time, able to answer some questions, and provided direct contact.

## 2023-08-02 DIAGNOSIS — L89522 Pressure ulcer of left ankle, stage 2: Secondary | ICD-10-CM | POA: Diagnosis not present

## 2023-08-02 DIAGNOSIS — I10 Essential (primary) hypertension: Secondary | ICD-10-CM | POA: Diagnosis not present

## 2023-08-02 DIAGNOSIS — H3530 Unspecified macular degeneration: Secondary | ICD-10-CM | POA: Diagnosis not present

## 2023-08-02 DIAGNOSIS — F0390 Unspecified dementia without behavioral disturbance: Secondary | ICD-10-CM | POA: Diagnosis not present

## 2023-08-02 DIAGNOSIS — M810 Age-related osteoporosis without current pathological fracture: Secondary | ICD-10-CM | POA: Diagnosis not present

## 2023-08-05 DIAGNOSIS — L89522 Pressure ulcer of left ankle, stage 2: Secondary | ICD-10-CM | POA: Diagnosis not present

## 2023-08-05 DIAGNOSIS — F0390 Unspecified dementia without behavioral disturbance: Secondary | ICD-10-CM | POA: Diagnosis not present

## 2023-08-05 DIAGNOSIS — I10 Essential (primary) hypertension: Secondary | ICD-10-CM | POA: Diagnosis not present

## 2023-08-05 DIAGNOSIS — M810 Age-related osteoporosis without current pathological fracture: Secondary | ICD-10-CM | POA: Diagnosis not present

## 2023-08-05 DIAGNOSIS — H3530 Unspecified macular degeneration: Secondary | ICD-10-CM | POA: Diagnosis not present

## 2023-08-09 ENCOUNTER — Encounter (HOSPITAL_BASED_OUTPATIENT_CLINIC_OR_DEPARTMENT_OTHER): Admitting: Internal Medicine

## 2023-08-09 DIAGNOSIS — L89520 Pressure ulcer of left ankle, unstageable: Secondary | ICD-10-CM | POA: Diagnosis not present

## 2023-08-09 DIAGNOSIS — F03C4 Unspecified dementia, severe, with anxiety: Secondary | ICD-10-CM | POA: Diagnosis not present

## 2023-08-09 DIAGNOSIS — L89896 Pressure-induced deep tissue damage of other site: Secondary | ICD-10-CM | POA: Diagnosis not present

## 2023-08-09 DIAGNOSIS — I70243 Atherosclerosis of native arteries of left leg with ulceration of ankle: Secondary | ICD-10-CM

## 2023-08-10 DIAGNOSIS — M810 Age-related osteoporosis without current pathological fracture: Secondary | ICD-10-CM | POA: Diagnosis not present

## 2023-08-10 DIAGNOSIS — F0390 Unspecified dementia without behavioral disturbance: Secondary | ICD-10-CM | POA: Diagnosis not present

## 2023-08-10 DIAGNOSIS — I10 Essential (primary) hypertension: Secondary | ICD-10-CM | POA: Diagnosis not present

## 2023-08-10 DIAGNOSIS — L89522 Pressure ulcer of left ankle, stage 2: Secondary | ICD-10-CM | POA: Diagnosis not present

## 2023-08-10 DIAGNOSIS — H3530 Unspecified macular degeneration: Secondary | ICD-10-CM | POA: Diagnosis not present

## 2023-08-13 DIAGNOSIS — M810 Age-related osteoporosis without current pathological fracture: Secondary | ICD-10-CM | POA: Diagnosis not present

## 2023-08-13 DIAGNOSIS — F0390 Unspecified dementia without behavioral disturbance: Secondary | ICD-10-CM | POA: Diagnosis not present

## 2023-08-13 DIAGNOSIS — I10 Essential (primary) hypertension: Secondary | ICD-10-CM | POA: Diagnosis not present

## 2023-08-13 DIAGNOSIS — H3530 Unspecified macular degeneration: Secondary | ICD-10-CM | POA: Diagnosis not present

## 2023-08-13 DIAGNOSIS — L89522 Pressure ulcer of left ankle, stage 2: Secondary | ICD-10-CM | POA: Diagnosis not present

## 2023-08-16 ENCOUNTER — Encounter: Payer: Self-pay | Admitting: Neurology

## 2023-08-17 DIAGNOSIS — F0390 Unspecified dementia without behavioral disturbance: Secondary | ICD-10-CM | POA: Diagnosis not present

## 2023-08-17 DIAGNOSIS — I10 Essential (primary) hypertension: Secondary | ICD-10-CM | POA: Diagnosis not present

## 2023-08-17 DIAGNOSIS — H3530 Unspecified macular degeneration: Secondary | ICD-10-CM | POA: Diagnosis not present

## 2023-08-17 DIAGNOSIS — L89522 Pressure ulcer of left ankle, stage 2: Secondary | ICD-10-CM | POA: Diagnosis not present

## 2023-08-17 DIAGNOSIS — M810 Age-related osteoporosis without current pathological fracture: Secondary | ICD-10-CM | POA: Diagnosis not present

## 2023-08-18 DIAGNOSIS — R8290 Unspecified abnormal findings in urine: Secondary | ICD-10-CM | POA: Diagnosis not present

## 2023-08-18 DIAGNOSIS — L89522 Pressure ulcer of left ankle, stage 2: Secondary | ICD-10-CM | POA: Diagnosis not present

## 2023-08-18 DIAGNOSIS — F0390 Unspecified dementia without behavioral disturbance: Secondary | ICD-10-CM | POA: Diagnosis not present

## 2023-08-20 ENCOUNTER — Ambulatory Visit: Payer: Medicare PPO | Admitting: Internal Medicine

## 2023-08-23 ENCOUNTER — Encounter (HOSPITAL_BASED_OUTPATIENT_CLINIC_OR_DEPARTMENT_OTHER): Attending: Internal Medicine | Admitting: Internal Medicine

## 2023-08-23 DIAGNOSIS — I70243 Atherosclerosis of native arteries of left leg with ulceration of ankle: Secondary | ICD-10-CM | POA: Diagnosis not present

## 2023-08-23 DIAGNOSIS — L89520 Pressure ulcer of left ankle, unstageable: Secondary | ICD-10-CM | POA: Diagnosis not present

## 2023-08-23 DIAGNOSIS — L89896 Pressure-induced deep tissue damage of other site: Secondary | ICD-10-CM | POA: Insufficient documentation

## 2023-08-23 DIAGNOSIS — F03C4 Unspecified dementia, severe, with anxiety: Secondary | ICD-10-CM | POA: Diagnosis not present

## 2023-08-24 DIAGNOSIS — F0390 Unspecified dementia without behavioral disturbance: Secondary | ICD-10-CM | POA: Diagnosis not present

## 2023-08-24 DIAGNOSIS — I10 Essential (primary) hypertension: Secondary | ICD-10-CM | POA: Diagnosis not present

## 2023-08-24 DIAGNOSIS — H3530 Unspecified macular degeneration: Secondary | ICD-10-CM | POA: Diagnosis not present

## 2023-08-24 DIAGNOSIS — M810 Age-related osteoporosis without current pathological fracture: Secondary | ICD-10-CM | POA: Diagnosis not present

## 2023-08-24 DIAGNOSIS — L89522 Pressure ulcer of left ankle, stage 2: Secondary | ICD-10-CM | POA: Diagnosis not present

## 2023-08-27 DIAGNOSIS — L89892 Pressure ulcer of other site, stage 2: Secondary | ICD-10-CM | POA: Diagnosis not present

## 2023-08-27 DIAGNOSIS — F0390 Unspecified dementia without behavioral disturbance: Secondary | ICD-10-CM | POA: Diagnosis not present

## 2023-08-27 DIAGNOSIS — L89522 Pressure ulcer of left ankle, stage 2: Secondary | ICD-10-CM | POA: Diagnosis not present

## 2023-08-27 DIAGNOSIS — H353113 Nonexudative age-related macular degeneration, right eye, advanced atrophic without subfoveal involvement: Secondary | ICD-10-CM | POA: Diagnosis not present

## 2023-08-27 DIAGNOSIS — I10 Essential (primary) hypertension: Secondary | ICD-10-CM | POA: Diagnosis not present

## 2023-08-27 DIAGNOSIS — H3530 Unspecified macular degeneration: Secondary | ICD-10-CM | POA: Diagnosis not present

## 2023-08-27 DIAGNOSIS — M810 Age-related osteoporosis without current pathological fracture: Secondary | ICD-10-CM | POA: Diagnosis not present

## 2023-08-27 DIAGNOSIS — H35432 Paving stone degeneration of retina, left eye: Secondary | ICD-10-CM | POA: Diagnosis not present

## 2023-08-27 DIAGNOSIS — H43813 Vitreous degeneration, bilateral: Secondary | ICD-10-CM | POA: Diagnosis not present

## 2023-08-27 DIAGNOSIS — Z9181 History of falling: Secondary | ICD-10-CM | POA: Diagnosis not present

## 2023-08-27 DIAGNOSIS — H353222 Exudative age-related macular degeneration, left eye, with inactive choroidal neovascularization: Secondary | ICD-10-CM | POA: Diagnosis not present

## 2023-08-30 ENCOUNTER — Encounter (HOSPITAL_COMMUNITY): Payer: Self-pay

## 2023-08-30 ENCOUNTER — Other Ambulatory Visit (HOSPITAL_COMMUNITY): Payer: Self-pay | Admitting: Internal Medicine

## 2023-08-30 ENCOUNTER — Encounter (HOSPITAL_COMMUNITY)

## 2023-08-30 ENCOUNTER — Ambulatory Visit (HOSPITAL_COMMUNITY): Admission: RE | Admit: 2023-08-30 | Source: Ambulatory Visit

## 2023-08-30 DIAGNOSIS — L97329 Non-pressure chronic ulcer of left ankle with unspecified severity: Secondary | ICD-10-CM | POA: Diagnosis not present

## 2023-08-30 DIAGNOSIS — L89520 Pressure ulcer of left ankle, unstageable: Secondary | ICD-10-CM

## 2023-08-30 DIAGNOSIS — I70243 Atherosclerosis of native arteries of left leg with ulceration of ankle: Secondary | ICD-10-CM

## 2023-08-31 ENCOUNTER — Emergency Department (HOSPITAL_BASED_OUTPATIENT_CLINIC_OR_DEPARTMENT_OTHER)
Admission: EM | Admit: 2023-08-31 | Discharge: 2023-09-01 | Disposition: A | Discharge status: Home / Self Care | Attending: Emergency Medicine | Admitting: Emergency Medicine

## 2023-08-31 ENCOUNTER — Encounter (HOSPITAL_BASED_OUTPATIENT_CLINIC_OR_DEPARTMENT_OTHER): Payer: Self-pay

## 2023-08-31 ENCOUNTER — Other Ambulatory Visit: Payer: Self-pay

## 2023-08-31 DIAGNOSIS — F0390 Unspecified dementia without behavioral disturbance: Secondary | ICD-10-CM | POA: Insufficient documentation

## 2023-08-31 DIAGNOSIS — Z91040 Latex allergy status: Secondary | ICD-10-CM | POA: Diagnosis not present

## 2023-08-31 DIAGNOSIS — I10 Essential (primary) hypertension: Secondary | ICD-10-CM | POA: Insufficient documentation

## 2023-08-31 DIAGNOSIS — E860 Dehydration: Secondary | ICD-10-CM | POA: Insufficient documentation

## 2023-08-31 DIAGNOSIS — Z79899 Other long term (current) drug therapy: Secondary | ICD-10-CM | POA: Insufficient documentation

## 2023-08-31 DIAGNOSIS — R638 Other symptoms and signs concerning food and fluid intake: Secondary | ICD-10-CM | POA: Diagnosis not present

## 2023-08-31 DIAGNOSIS — N179 Acute kidney failure, unspecified: Secondary | ICD-10-CM | POA: Insufficient documentation

## 2023-08-31 DIAGNOSIS — I2510 Atherosclerotic heart disease of native coronary artery without angina pectoris: Secondary | ICD-10-CM | POA: Diagnosis not present

## 2023-08-31 LAB — COMPREHENSIVE METABOLIC PANEL WITH GFR
ALT: 26 U/L (ref 0–44)
AST: 40 U/L (ref 15–41)
Albumin: 3.2 g/dL — ABNORMAL LOW (ref 3.5–5.0)
Alkaline Phosphatase: 62 U/L (ref 38–126)
Anion gap: 10 (ref 5–15)
BUN: 67 mg/dL — ABNORMAL HIGH (ref 8–23)
CO2: 26 mmol/L (ref 22–32)
Calcium: 9.3 mg/dL (ref 8.9–10.3)
Chloride: 100 mmol/L (ref 98–111)
Creatinine, Ser: 3.53 mg/dL — ABNORMAL HIGH (ref 0.44–1.00)
GFR, Estimated: 12 mL/min — ABNORMAL LOW (ref >60)
Glucose, Bld: 131 mg/dL — ABNORMAL HIGH (ref 70–99)
Potassium: 5 mmol/L (ref 3.5–5.1)
Sodium: 136 mmol/L (ref 135–145)
Total Bilirubin: 0.5 mg/dL (ref 0.0–1.2)
Total Protein: 6.6 g/dL (ref 6.5–8.1)

## 2023-08-31 LAB — CBC WITH DIFFERENTIAL/PLATELET
Abs Immature Granulocytes: 0.06 10*3/uL (ref 0.00–0.07)
Basophils Absolute: 0 10*3/uL (ref 0.0–0.1)
Basophils Relative: 0 %
Eosinophils Absolute: 0.2 10*3/uL (ref 0.0–0.5)
Eosinophils Relative: 1 %
HCT: 29.5 % — ABNORMAL LOW (ref 36.0–46.0)
Hemoglobin: 9.6 g/dL — ABNORMAL LOW (ref 12.0–15.0)
Immature Granulocytes: 1 %
Lymphocytes Relative: 10 %
Lymphs Abs: 1.4 10*3/uL (ref 0.7–4.0)
MCH: 31 pg (ref 26.0–34.0)
MCHC: 32.5 g/dL (ref 30.0–36.0)
MCV: 95.2 fL (ref 80.0–100.0)
Monocytes Absolute: 1.1 10*3/uL — ABNORMAL HIGH (ref 0.1–1.0)
Monocytes Relative: 9 %
Neutro Abs: 10.5 10*3/uL — ABNORMAL HIGH (ref 1.7–7.7)
Neutrophils Relative %: 79 %
Platelets: 217 10*3/uL (ref 150–400)
RBC: 3.1 MIL/uL — ABNORMAL LOW (ref 3.87–5.11)
RDW: 13.9 % (ref 11.5–15.5)
WBC: 13.2 10*3/uL — ABNORMAL HIGH (ref 4.0–10.5)
nRBC: 0 % (ref 0.0–0.2)

## 2023-08-31 LAB — MAGNESIUM: Magnesium: 3 mg/dL — ABNORMAL HIGH (ref 1.7–2.4)

## 2023-08-31 MED ORDER — SODIUM CHLORIDE 0.9 % IV BOLUS
1000.0000 mL | Freq: Once | INTRAVENOUS | Status: AC
  Administered 2023-08-31: 1000 mL via INTRAVENOUS

## 2023-08-31 NOTE — ED Provider Notes (Signed)
 [place] EMERGENCY DEPARTMENT AT [facility] Provider Note   CSN: [id number] Arrival date & time: 08/31/23  1728     History  Chief Complaint  Patient presents with   Dehydration    Sandra Cruz is a 89 y.o. female.  With a history of dementia, hypertension and CAD who presents to the ED for concern of dehydration.  Patient lives at home with her daughter and family and is very well cared for.  She is currently on doxycycline for chronic leg wounds and being treated for UTI with Macrobid.  Decreased p.o. intake over the last 2 days.  Not eating or drinking much and did not take her medications today.  Daughter voices concern for dehydration.  The goal for today's visit was rehydration keeping patient's comfort in mind.  No fevers or other complaints.  Patient at her neurologic baseline  HPI     Home Medications Prior to Admission medications   Medication Sig Start Date End Date Taking? Authorizing Provider  amLODipine (NORVASC) 2.5 MG tablet Take 2.5 mg by mouth 2 (two) times a day. 09/08/18   [provider]  carvedilol (COREG) 3.125 MG tablet Take 3.125 mg by mouth 2 (two) times a day.    [provider]  clotrimazole-betamethasone (LOTRISONE) cream Apply 1 application  topically 2 (two) times daily. As needed 08/27/21   [provider]  CRANBERRY PO Take by mouth. gummies    [provider]  Docusate Sodium (COLACE PO) Take 3 tablets by mouth daily.    [provider]  donepezil (ARICEPT) 10 MG tablet Take 1 tablet (10 mg total) by mouth every evening. 04/29/22   Glory Larsen, MD  dupilumab (DUPIXENT) 300 MG/2ML prefilled syringe Inject 300 mg into the skin every 14 (fourteen) days. 05/19/23   Orelia Binet, MD  Ferrous Sulfate (IRON PO) Take 65 mg by mouth daily.    [provider]  MAGNESIUM CITRATE PO Take by mouth.    [provider]  melatonin 3 MG TABS tablet Take 1.5 mg by mouth at bedtime.     [provider]  memantine (NAMENDA) 10 MG tablet Take 1 tablet (10 mg total) by mouth 2 (two) times daily. 04/29/22   Glory Larsen, MD  Multiple Vitamin (MULTIVITAMIN PO) 1 tablet at noon. Brand: Life extension-High Potency Multivitamin and Mineral Supplement    [provider]  nystatin cream (MYCOSTATIN) Apply 1 Application topically 2 (two) times daily as needed. 12/02/22   [provider]  Omega-3 Fatty Acids (FISH OIL) 500 MG CAPS Take 1 each by mouth daily at 12 noon.    [provider]  OVER THE COUNTER MEDICATION Calcium    [provider]  Probiotic Product (PROBIOTIC PO) Take 1 tablet by mouth daily at 12 noon. 15 strains/25 billion microorganisms    [provider]  senna (SENOKOT) 8.6 MG TABS tablet Take 3 tablets by mouth as needed for mild constipation.    [provider]  sodium chloride 1 g tablet Take 1 g by mouth daily. 08/15/18   [provider]  triamcinolone cream (KENALOG) 0.1 % Apply 1 Application topically as needed. 12/04/22   [provider]      Allergies    Codeine, Penicillins, Shellfish allergy, and Latex    Review of Systems   Review of Systems  Physical Exam Updated Vital Signs BP 117/61   Pulse 86   Temp 98.6 F (37 C)   Resp (!)  27   Ht 5' 4.5" (1.638 m)   Wt 72.6 kg   SpO2 96%   BMI 27.04 kg/m  Physical Exam Vitals and nursing note reviewed.  HENT:     Head: Normocephalic and atraumatic.  Eyes:     Pupils: Pupils are equal, round, and reactive to light.  Cardiovascular:     Rate and Rhythm: Normal rate and regular rhythm.  Pulmonary:     Effort: Pulmonary effort is normal.     Breath sounds: Normal breath sounds.  Abdominal:     Palpations: Abdomen is soft.     Tenderness: There is no abdominal tenderness.  Skin:    General: Skin is warm and dry.  Neurological:     Mental Status: She is alert.     Comments: Awake and alert Predominantly nonverbal,  baseline Moving all 4 extremities  Psychiatric:        Mood and Affect: Mood normal.     ED Results / Procedures / Treatments   Labs (all labs ordered are listed, but only abnormal results are displayed) Labs Reviewed  COMPREHENSIVE METABOLIC PANEL WITH GFR - Abnormal; Notable for the following components:      Result Value   Glucose, Bld 131 (*)    BUN 67 (*)    Creatinine, Ser 3.53 (*)    Albumin 3.2 (*)    GFR, Estimated 12 (*)    All other components within normal limits  CBC WITH DIFFERENTIAL/PLATELET - Abnormal; Notable for the following components:   WBC 13.2 (*)    RBC 3.10 (*)    Hemoglobin 9.6 (*)    HCT 29.5 (*)    Neutro Abs 10.5 (*)    Monocytes Absolute 1.1 (*)    All other components within normal limits  MAGNESIUM - Abnormal; Notable for the following components:   Magnesium 3.0 (*)    All other components within normal limits    EKG None  Radiology No results found.  Procedures Procedures    Medications Ordered in ED Medications  sodium chloride 0.9 % bolus 1,000 mL (0 mLs Intravenous Stopped 08/31/23 2200)  sodium chloride 0.9 % bolus 1,000 mL (1,000 mLs Intravenous New Bag/Given 08/31/23 2237)    ED Course/ Medical Decision Making/ A&P Clinical Course as of 08/31/23 2328  Tue Aug 31, 2023  2316 Significant renal impairment with BUN 67 and creatinine of 3.53.  Patient has received 2 L IV fluids.  Discussed renal function with daughter at bedside.  Again, preferences for patient comfort and getting her home tonight only.  Daughter declines offer for admission for acute kidney injury.  She will follow-up with her primary care doctor to have renal function and laboratory workup completed again.  Patient remains hemodynamically stable for discharge at this time [MP]    Clinical Course User Index [MP] Royanne Foots, DO                                 Medical Decision Making 89 year old female with history as above presenting to the ED for  concern of dehydration.  She is well cared for at home by her family.  On doxycycline for chronic lower extremity wounds with pneumoboots in place.  Also recently prescribed Macrobid for treatment of UTI.  Decreased p.o. intake.  Today's visit is in order to give her IV fluids for rehydration.  Family wants to limit the amount of testing and invasive procedures  here beyond IV rehydration and want to avoid hospitalization about cost.  She is currently being treated for UTI and they did not wish for urinalysis here tonight.  We will obtain basic laboratory workup since we are getting the IV and provide IV fluids for rehydration per the family's wishes and do our best to send her back home under the care of her family.  Amount and/or Complexity of Data Reviewed Labs: ordered.           Final Clinical Impression(s) / ED Diagnoses Final diagnoses:  Acute kidney injury [facility])  Dehydration    Rx / DC Orders ED Discharge Orders     None         Sallyanne Creamer, DO 08/31/23 2338

## 2023-08-31 NOTE — Discharge Instructions (Signed)
 Sandra Cruz was seen in the emergency department for dehydration Her kidney function appears significantly decreased from her baseline This is most likely due to severe dehydration We gave her 2 L of IV fluids here You did not wish to have her admitted to the hospital for further care and evaluation in an effort to keep her comfortable at home Please follow-up with your primary care doctor in the next week to have her kidney function retested Return to the emergency department if she stops eating and drinking Continue to push fluids at home

## 2023-08-31 NOTE — ED Triage Notes (Signed)
 In for eval of possible dehydration and possible UTI. Went to [facility] walk-in clinic for IV fluids, they stuck her twice but unable to establish IV. Currently taking doxycycline for leg wounds.

## 2023-09-02 ENCOUNTER — Encounter (HOSPITAL_BASED_OUTPATIENT_CLINIC_OR_DEPARTMENT_OTHER): Admitting: Internal Medicine

## 2023-09-03 ENCOUNTER — Telehealth (HOSPITAL_COMMUNITY): Payer: Self-pay

## 2023-09-03 NOTE — Telephone Encounter (Signed)
 Spoke with daughter of patient, daughter provided additional information for the reason of the appointment on 08/30/23 being marked no show. Daughter made every possible attempt to make contact and cancel appointment prior to arrival time.   Daughter also had concerns about a no show fee being applied to Sandra Cruz's account, I informed her that due to the way the appointment was canceled no fee should be applied. Also due to the fact that the attempt to cancel and reason for cancellation there should not be a fee.   09/03/23 - Spoke with daughter, no notation provided from previous call attempts, patient on 08/29/23 took turn for worse and daughter states will call back to schedule again when this situation improves. Noted below are the two other calls made by daughter to attempt to cancel the appointment.   Attempt 2 - 08/30/23 - daughter called back, to cancel, phone respondant did not cancel appointment Attempt 1 - 08/29/23 - daughter called, got recording, didnt leave message with call center - wanting to cancel appt   I expressed concern for Sandra Cruz's condition and expressed my hope for her condition to improve. Daughter thanked me for my compassion and caring.

## 2023-09-04 DIAGNOSIS — F0390 Unspecified dementia without behavioral disturbance: Secondary | ICD-10-CM | POA: Diagnosis not present

## 2023-09-04 DIAGNOSIS — L89892 Pressure ulcer of other site, stage 2: Secondary | ICD-10-CM | POA: Diagnosis not present

## 2023-09-04 DIAGNOSIS — H3530 Unspecified macular degeneration: Secondary | ICD-10-CM | POA: Diagnosis not present

## 2023-09-04 DIAGNOSIS — I10 Essential (primary) hypertension: Secondary | ICD-10-CM | POA: Diagnosis not present

## 2023-09-04 DIAGNOSIS — M810 Age-related osteoporosis without current pathological fracture: Secondary | ICD-10-CM | POA: Diagnosis not present

## 2023-09-04 DIAGNOSIS — L89522 Pressure ulcer of left ankle, stage 2: Secondary | ICD-10-CM | POA: Diagnosis not present

## 2023-09-04 DIAGNOSIS — Z9181 History of falling: Secondary | ICD-10-CM | POA: Diagnosis not present

## 2023-09-06 ENCOUNTER — Ambulatory Visit (HOSPITAL_BASED_OUTPATIENT_CLINIC_OR_DEPARTMENT_OTHER): Admitting: Internal Medicine

## 2023-09-10 ENCOUNTER — Ambulatory Visit (HOSPITAL_BASED_OUTPATIENT_CLINIC_OR_DEPARTMENT_OTHER): Admitting: Internal Medicine

## 2023-10-18 ENCOUNTER — Encounter: Payer: Self-pay | Admitting: Cardiology

## 2023-10-22 ENCOUNTER — Ambulatory Visit: Admitting: Internal Medicine

## 2023-11-23 ENCOUNTER — Telehealth: Payer: Self-pay

## 2023-11-23 NOTE — Telephone Encounter (Signed)
VOB initiated °

## 2023-11-23 NOTE — Telephone Encounter (Signed)
 Prior Authorization initiated for ZILRETTA  for BILAT knee OA via [email or web address] KEY: [id number]

## 2023-11-23 NOTE — Telephone Encounter (Signed)
 Zilretta  for BILAT knee OA.

## 2023-11-24 NOTE — Telephone Encounter (Signed)
 Patient is scheduled for on appointment on 11/26/23. When the appointment was made, the daughter mentioned only being seen to for the right knee and for it to be drained. I have added a note about Zilretta  approval and two are labeled for her if they decide to proceed.

## 2023-11-24 NOTE — Telephone Encounter (Signed)
 [employer]  Prior Authorization for ZILRETTA  APPROVED PA# [id number] Valid: 11/23/23-05/25/24

## 2023-11-24 NOTE — Progress Notes (Deleted)
   LILLETTE Ileana Collet, PhD, LAT, ATC acting as a scribe for Artist Lloyd, MD.  Sandra Cruz is a 89 y.o. female who presents to [facility] today for bilat knee pain that is chronic in nature.   Of note, pt is currently under the care of hospice.   Pertinent review of systems: ***  Relevant historical information: ***   Exam:  There were no vitals taken for this visit. General: Well Developed, well nourished, and in no acute distress.   MSK: ***    Lab and Radiology Results No results found for this or any previous visit (from the past 72 hours). No results found.     Assessment and Plan: 89 y.o. female with ***   PDMP not reviewed this encounter. No orders of the defined types were placed in this encounter.  No orders of the defined types were placed in this encounter.    Discussed warning signs or symptoms. Please see discharge instructions. Patient expresses understanding.   ***

## 2023-11-25 NOTE — Telephone Encounter (Signed)
 Zilretta  authorized for bilateral knee Deductible does not apply OOP MAX $2590 has met $83.49 Coinsurance is 4% Once OOP has been met coverage goes to 100% PA APPROVED AUTH # [id number] 11/23/23-05/25/24

## 2023-11-26 ENCOUNTER — Ambulatory Visit: Admitting: Family Medicine

## 2023-12-01 ENCOUNTER — Ambulatory Visit: Admitting: Family Medicine

## 2023-12-02 ENCOUNTER — Ambulatory Visit: Admitting: Family Medicine

## 2023-12-02 ENCOUNTER — Other Ambulatory Visit: Payer: Self-pay

## 2023-12-02 ENCOUNTER — Encounter: Payer: Self-pay | Admitting: Family Medicine

## 2023-12-02 VITALS — BP 120/70 | HR 80

## 2023-12-02 DIAGNOSIS — M25561 Pain in right knee: Secondary | ICD-10-CM | POA: Diagnosis not present

## 2023-12-02 DIAGNOSIS — M170 Bilateral primary osteoarthritis of knee: Secondary | ICD-10-CM | POA: Diagnosis not present

## 2023-12-02 DIAGNOSIS — M25562 Pain in left knee: Secondary | ICD-10-CM

## 2023-12-02 DIAGNOSIS — L02419 Cutaneous abscess of limb, unspecified: Secondary | ICD-10-CM

## 2023-12-02 DIAGNOSIS — G8929 Other chronic pain: Secondary | ICD-10-CM | POA: Diagnosis not present

## 2023-12-02 DIAGNOSIS — F02C11 Dementia in other diseases classified elsewhere, severe, with agitation: Secondary | ICD-10-CM

## 2023-12-02 DIAGNOSIS — G301 Alzheimer's disease with late onset: Secondary | ICD-10-CM

## 2023-12-02 MED ORDER — TRIAMCINOLONE ACETONIDE 32 MG IX SRER
32.0000 mg | Freq: Once | INTRA_ARTICULAR | Status: AC
  Administered 2023-12-02: 32 mg via INTRA_ARTICULAR

## 2023-12-02 NOTE — Progress Notes (Signed)
 I, Claretha Schimke am a scribe for Dr. Artist Lloyd, MD.  Sandra Cruz is a 89 y.o. female who presents to [facility] at [facility] today for bilat knee pain that is chronic in nature. Patient would like to get R knee drained. Some times it is blood some time it is a clear liquid. Pain is chronic. The draining has been the last two months. Gel injections in the past has helped. Use voltaren gel two to three times a day. Tylenol  1000 mg 3 times a day.   Of note, pt is currently under the care of hospice for her severe dementia.  Her daughter also notes that she has a draining abscess on the anterior right knee that has been treated with levofloxacin by the hospice doctor.  She does not seem to have a lot of redness or pain around this area but it is draining stringy white material.  No obvious fever.  Pertinent review of systems: No fever  Relevant historical information: Broke right hip in 2019. Patient has severe dementia and does not speak or walk.  She is under the care of hospice for dementia but is pretty stable medically.   Exam:  BP 120/70   Pulse 80   SpO2 98%  General: Very elderly and frail appearing woman seated in a wheelchair.  Does not appear to be expressing any distress.  MSK: Right knee minimal effusion.  Patient does have a nonerythematous partially treated abscess draining a whitish material at the anterior knee.  At the area of planned injection at the superior lateral knee the skin is normal-appearing with no erythema.  Left knee mild effusion.  Decreased range of motion both knees    Lab and Radiology Results   Zilretta  injection bilateral knee Procedure: Real-time Ultrasound Guided Injection of right superior lateral knee joint superior lateral patellar space Device: Philips Affiniti 50G Images permanently stored and available for review in PACS Verbal informed consent obtained.  Discussed risks and benefits of procedure. Warned about  infection, hyperglycemia bleeding, damage to structures among others. Patient expresses understanding and agreement Time-out conducted.   Noted no overlying erythema, induration, or other signs of local infection.   Skin prepped in a sterile fashion.   Local anesthesia: Topical Ethyl chloride.   With sterile technique and under real time ultrasound guidance: Zilretta  32 mg injected into knee joint. Fluid seen entering the joint capsule.   Completed without difficulty   Advised to call if fevers/chills, erythema, induration, drainage, or persistent bleeding.   Images permanently stored and available for review in the ultrasound unit.  Impression: Technically successful ultrasound guided injection.   Procedure: Real-time Ultrasound Guided Injection of left knee joint anterior lateral to patellar approach Device: Philips Affiniti 50G Images permanently stored and available for review in PACS Verbal informed consent obtained.  Discussed risks and benefits of procedure. Warned about infection, hyperglycemia bleeding, damage to structures among others. Patient expresses understanding and agreement Time-out conducted.   Noted no overlying erythema, induration, or other signs of local infection.   Skin prepped in a sterile fashion.   Local anesthesia: Topical Ethyl chloride.   With sterile technique and under real time ultrasound guidance: Zilretta  32 mg injected into knee joint. Fluid seen entering the joint capsule.   Completed without difficulty   Advised to call if fevers/chills, erythema, induration, drainage, or persistent bleeding.   Images permanently stored and available for review in the ultrasound unit.  Impression: Technically successful ultrasound guided injection.  Lot number: [id number] for both inject     Assessment and Plan: 89 y.o. female with bilateral knee pain.  Patient has existing severe arthritis which is causing pain that patient expresses via grimacing and moaning or  crying.  Her family has noted that moving her around is much more painful which is her typical behavior when her knees are bothering her.  She has benefited from Zilretta  injections in the past.  After shared decision making we proceeded to inject both knees today.  I used the superior lateral approach on the right knee to avoid the partially treated abscess on the anterior knee.  She had no area of induration or erythema at the superior lateral knee to indicate there is any infection in that area.  This injection should be safe and the risk versus benefit of doing the injection today versus having her return is worth doing the injection.  She can return in 3 months for repeat Zilretta  injection as needed.  Additionally we will discuss her abscess with her primary care provider.  This looks like it would benefit from incision and drainage.  I will discuss with PCP before she makes an appointment as the PCP needs to be equipped to be able to do this in the clinic.   PDMP not reviewed this encounter. Orders Placed This Encounter  Procedures   US  LIMITED JOINT SPACE STRUCTURES LOW BILAT(NO LINKED CHARGES)    Reason for Exam (SYMPTOM  OR DIAGNOSIS REQUIRED):   bilateral knee pain    Preferred imaging location?:   [facility] ordered this encounter  Medications   Triamcinolone  Acetonide (ZILRETTA ) intra-articular injection 32 mg   Triamcinolone  Acetonide (ZILRETTA ) intra-articular injection 32 mg     Discussed warning signs or symptoms. Please see discharge instructions. Patient expresses understanding.   The above documentation has been reviewed and is accurate and complete Artist Lloyd, M.D.

## 2023-12-02 NOTE — Patient Instructions (Signed)
 Thank you for coming in today.   You received an injection today. Seek immediate medical attention if the joint becomes red, extremely painful, or is oozing fluid.

## 2023-12-03 ENCOUNTER — Encounter: Payer: Self-pay | Admitting: Internal Medicine

## 2023-12-10 ENCOUNTER — Encounter: Payer: Self-pay | Admitting: Family Medicine

## 2023-12-10 DIAGNOSIS — L02419 Cutaneous abscess of limb, unspecified: Secondary | ICD-10-CM

## 2023-12-13 NOTE — Telephone Encounter (Signed)
 Forwarding to Dr. Denyse Amass to review and advise.

## 2023-12-17 ENCOUNTER — Other Ambulatory Visit: Payer: Self-pay

## 2023-12-17 ENCOUNTER — Encounter: Payer: Self-pay | Admitting: Internal Medicine

## 2023-12-17 ENCOUNTER — Ambulatory Visit (INDEPENDENT_AMBULATORY_CARE_PROVIDER_SITE_OTHER): Admitting: Internal Medicine

## 2023-12-17 VITALS — BP 122/80 | HR 69 | Temp 98.1°F | Ht 64.0 in | Wt 140.0 lb

## 2023-12-17 DIAGNOSIS — L281 Prurigo nodularis: Secondary | ICD-10-CM

## 2023-12-17 DIAGNOSIS — Z8709 Personal history of other diseases of the respiratory system: Secondary | ICD-10-CM | POA: Diagnosis not present

## 2023-12-17 DIAGNOSIS — F02C11 Dementia in other diseases classified elsewhere, severe, with agitation: Secondary | ICD-10-CM

## 2023-12-17 DIAGNOSIS — G301 Alzheimer's disease with late onset: Secondary | ICD-10-CM

## 2023-12-17 NOTE — Patient Instructions (Addendum)
 Prugio Nodularis- histamine driven  Continue Allegra 180mg  twice daily  Continue Dupixent  300mg  every 4 weeks  Continue gentle skin care   Follow up: 3 months   Thank you so much for letting me partake in your care today.  Don't hesitate to reach out if you have any additional concerns!  [name], MD  [facility] [place] [place]   Daily Skin Care  - Recommend hypoallergenic hydrating ointment at least twice daily.  This must be done daily for control of flares. (Great options include Vaseline, CeraVe, Aquaphor, Aveeno, Cetaphil, VaniCream, etc) - Recommend avoiding detergents, soaps or lotions with fragrances/dyes, and instead using products which are hypoallergenic, use second rinse cycle when washing clothes -Wear lose breathable clothing, avoid wool -Avoid extremes of humidity

## 2023-12-17 NOTE — Progress Notes (Signed)
 Follow Up Note  RE: Sandra Cruz MRN: [id number] DOB: 1929-08-10 Date of Office Visit: 12/17/2023  Referring provider: Kip Righter, MD Primary care provider: Kip Righter, MD  Chief Complaint: Follow-up (Daughter states that the Dupixent  is really working. There has been no scratching or itching. She states that they have not used the triamcinolone  cream.)  History of Present Illness: I had the pleasure of seeing Sandra Cruz for a follow up visit at the [facility] on 12/19/2023. She is a 89 y.o. female, who is being followed for  Her previous allergy  office visit was on 12/18/22 with Dr. Lorin. Today is a regular follow up visit.  History obtained from patient, chart review and daughter.  Discussed the use of AI scribe software for clinical note transcription with the patient, who gave verbal consent to proceed.  History of Present Illness Sandra Cruz Sandra Cruz is a 89 year old female who presents for follow-up on Dupixent  treatment for chronic itch. She is accompanied by her daughter, who is also her primary caregiver.  Pruritus management - Chronic pruritus treated with Dupixent  - Dupixent  administered every two weeks - Significant improvement in pruritus symptoms - Describes Dupixent  as 'fantastic' - Noted effects from the medications  Musculoskeletal pain - Knee pain present - Trialing a new muscle relaxant for pain management - Has tried various pain medications with limited relief  Her dementia continues to progress.    Assessment and Plan: Sandra Cruz is a 89 y.o. 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  Don't hesitate to  reach out if you have any additional concerns!  [name], MD  [facility] [place] [place]   Daily Skin Care  - Recommend hypoallergenic hydrating ointment at least twice daily.  This must be done daily for control of flares. (Great options include Vaseline, CeraVe, Aquaphor, Aveeno, Cetaphil, VaniCream, etc) - Recommend avoiding detergents, soaps or lotions with fragrances/dyes, and instead using products which are hypoallergenic, use second rinse cycle when washing clothes -Wear lose breathable clothing, avoid wool -Avoid extremes of humidity   No orders of the defined types were placed in this encounter.   Lab Orders  No laboratory test(s) ordered today   Diagnostics: None done  Medication List:  Current Outpatient Medications  Medication Sig Dispense Refill   amLODipine  (NORVASC ) 2.5 MG tablet Take 2.5 mg by mouth 2 (two) times a day.     carvedilol  (COREG ) 3.125 MG tablet Take 3.125 mg by mouth 2 (two) times a day.     clotrimazole-betamethasone (LOTRISONE) cream Apply 1 application  topically 2 (two) times daily. As needed     CRANBERRY PO Take by mouth. gummies     Docusate Sodium (COLACE PO) Take 3 tablets by mouth daily.     donepezil  (ARICEPT ) 10 MG tablet Take 1 tablet (10 mg total) by mouth every evening. 90 tablet 3   dupilumab  (DUPIXENT ) 300 MG/2ML prefilled syringe Inject 300 mg into the skin every 14 (fourteen) days. 4 mL 11   Ferrous Sulfate (IRON PO) Take 65 mg by mouth daily.     MAGNESIUM CITRATE PO Take by mouth.  melatonin 3 MG TABS tablet Take 1.5 mg by mouth at bedtime.     memantine  (NAMENDA ) 10 MG tablet Take 1 tablet (10 mg total) by mouth 2 (two) times daily. 180 tablet 3   Multiple Vitamin (MULTIVITAMIN PO) 1 tablet at noon. Brand: Life extension-High Potency Multivitamin and Mineral Supplement     nystatin cream (MYCOSTATIN) Apply 1 Application topically 2 (two) times daily as needed.     Omega-3 Fatty Acids (FISH OIL) 500 MG  CAPS Take 1 each by mouth daily at 12 noon.     OVER THE COUNTER MEDICATION Calcium     Probiotic Product (PROBIOTIC PO) Take 1 tablet by mouth daily at 12 noon. 15 strains/25 billion microorganisms     senna (SENOKOT) 8.6 MG TABS tablet Take 3 tablets by mouth as needed for mild constipation.     sodium chloride  1 g tablet Take 1 g by mouth daily.     tiZANidine (ZANAFLEX) 2 MG tablet Take 2 mg by mouth 3 (three) times daily.     triamcinolone  cream (KENALOG ) 0.1 % Apply 1 Application topically as needed.     No current facility-administered medications for this visit.   Allergies: Allergies  Allergen Reactions   Codeine Shortness Of Breath   Penicillins Swelling and Rash    Did it involve swelling of the face/tongue/throat, SOB, or low BP? No Did it involve sudden or severe rash/hives, skin peeling, or any reaction on the inside of your mouth or nose? No Did you need to seek medical attention at a hospital or doctor's office? No When did it last happen?      childhood If all above answers are "NO", may proceed with cephalosporin use.   Shellfish Allergy  Shortness Of Breath   Latex Other (See Comments)    blisters   I reviewed her past medical history, social history, family history, and environmental history and no significant changes have been reported from her previous visit.  ROS: All others negative except as noted per HPI.   Objective: BP 122/80   Pulse 69   Temp 98.1 F (36.7 C)   Ht 5' 4 (1.626 m)   Wt 140 lb (63.5 kg)   SpO2 95%   BMI 24.03 kg/m  Body mass index is 24.03 kg/m. General Appearance:  Alert, can respond to name no distress, appears stated age, in wheelchair   Head:  Normocephalic, without obvious abnormality, atraumatic  Eyes:  Conjunctiva clear, EOM's intact  Nose: Nares normal,   Throat: Lips, tongue normal; teeth and gums normal,   Neck: Supple, symmetrical  Lungs:   , Respirations unlabored, no coughing  Heart:  , Appears well perfused   Extremities: No edema  Skin: Skin color, texture, turgor normal, hyperpigmented nodules on trunk- improved from prior  Neurologic: No gross deficits   Previous notes and tests were reviewed. The plan was reviewed with the patient/family, and all questions/concerned were addressed.  It was my pleasure to see Sandra Cruz today and participate in her care. Please feel free to contact me with any questions or concerns.  Sincerely,  [name], MD  Allergy  & Immunology  [facility]  [place] Office: [phone]

## 2023-12-22 DIAGNOSIS — F0390 Unspecified dementia without behavioral disturbance: Secondary | ICD-10-CM | POA: Diagnosis not present

## 2023-12-22 DIAGNOSIS — L89324 Pressure ulcer of left buttock, stage 4: Secondary | ICD-10-CM | POA: Diagnosis not present

## 2023-12-22 DIAGNOSIS — L89624 Pressure ulcer of left heel, stage 4: Secondary | ICD-10-CM | POA: Diagnosis not present

## 2023-12-22 DIAGNOSIS — L89524 Pressure ulcer of left ankle, stage 4: Secondary | ICD-10-CM | POA: Diagnosis not present

## 2023-12-22 DIAGNOSIS — R64 Cachexia: Secondary | ICD-10-CM | POA: Diagnosis not present

## 2023-12-28 ENCOUNTER — Encounter: Payer: Self-pay | Admitting: Orthopedic Surgery

## 2023-12-28 ENCOUNTER — Ambulatory Visit: Admitting: Orthopedic Surgery

## 2023-12-28 DIAGNOSIS — I70234 Atherosclerosis of native arteries of right leg with ulceration of heel and midfoot: Secondary | ICD-10-CM

## 2023-12-28 DIAGNOSIS — L8945 Pressure ulcer of contiguous site of back, buttock and hip, unstageable: Secondary | ICD-10-CM

## 2023-12-28 MED ORDER — NITROGLYCERIN 0.2 MG/HR TD PT24: 0.2000 mg | MEDICATED_PATCH | Freq: Every day | TRANSDERMAL | 12 refills | Status: DC

## 2023-12-28 NOTE — Progress Notes (Signed)
 Office Visit Note   Patient: Sandra Cruz           Date of Birth: 02/29/1930           MRN: [id number] Visit Date: 12/28/2023              Requested by: Marquette Ozell BIRCH, DO [address] [place],  [place] [address] PCP: Kip Righter, MD  Chief Complaint  Patient presents with   Left Ankle - Wound Check      HPI: Patient is a 89 year old woman who is in hospice.  Patient is seen for initial evaluation for sacral ulcers bilateral hip ulcers and necrotic ulcers to the left foot.  Patient has been in hospice since April of this year.  Patient has flexion contractures and there is Hoyer lift for transfers patient referred by Dr. Marquette.  Assessment & Plan: Visit Diagnoses:  1. Atherosclerosis of native arteries of right leg with ulceration of heel and midfoot (HCC)   2. Pressure injury of contiguous region involving back, buttock, and hip, unstageable, unspecified laterality (HCC)     Plan: Recommended Dial soap cleansing to all wounds.  Patient may get the skin and periwound skin wet,   And continue using her skin prep.  Recommended changing from current dressings to Vashe dampened gauze to all wounds change daily.  Recommended wound care changes to simplify treatment and to improve wound bed viability.  Patient's daughter was provided a bottle of Vashe and she states she has some at home as well.  This should simplify the wound care with Vashe to all wounds and secured with either tape or circumferential wraps.  Patient is also provided a prescription for nitroglycerin  patch to apply to the dorsum of the left foot with the location change daily  Follow-Up Instructions: Return in about 2 weeks (around 01/11/2024).   Ortho Exam   Patient is alert, oriented, no adenopathy, well-dressed, normal affect, normal respiratory effort. Facilitate the wound care.  Examination patient has multiple decubitus ulcers including the sacrum bilateral hips right thigh and multiple necrotic  ulcers to the left foot.  There is a necrotic ulcer of the lateral heel that is 3 cm in diameter the necrotic tissue was debrided.  The lateral malleolus has a flat wound that has 50% granulation tissue.  There is also ulceration over the second toe and fifth metatarsal head with healthy granulation tissue.  With the Doppler patient has a strong dorsalis pedis biphasic Doppler pulse.  She has no large vessel disease to the ankle but does have microvascular disease distally.    Imaging: No results found. No images are attached to the encounter.  Labs: No results found for: HGBA1C, ESRSEDRATE, CRP, LABURIC, REPTSTATUS, GRAMSTAIN, CULT, LABORGA   Lab Results  Component Value Date   ALBUMIN 3.2 (L) 08/31/2023   ALBUMIN 3.8 12/18/2022    Lab Results  Component Value Date   MG 3.0 (H) 08/31/2023   No results found for: VD25OH  No results found for: PREALBUMIN    Latest Ref Rng & Units 08/31/2023    9:07 PM 12/18/2022   10:10 AM 11/14/2018    5:55 AM  CBC EXTENDED  WBC 4.0 - 10.5 K/uL 13.2  6.9  12.8   RBC 3.87 - 5.11 MIL/uL 3.10  4.08  4.26   Hemoglobin 12.0 - 15.0 g/dL 9.6  86.9  86.9   HCT 63.9 - 46.0 % 29.5  38.6  41.4   Platelets 150 - 400  K/uL 217  228  174   NEUT# 1.7 - 7.7 K/uL 10.5  3.9  10.7   Lymph# 0.7 - 4.0 K/uL 1.4  1.3  1.1      There is no height or weight on file to calculate BMI.  Orders:  No orders of the defined types were placed in this encounter.  Meds ordered this encounter  Medications   nitroGLYCERIN  (NITRODUR - DOSED IN MG/24 HR) 0.2 mg/hr patch    Sig: Place 1 patch (0.2 mg total) onto the skin daily.    Dispense:  30 patch    Refill:  12     Procedures: No procedures performed  Clinical Data: No additional findings.  ROS:  All other systems negative, except as noted in the HPI. Review of Systems  Objective: Vital Signs: There were no vitals taken for this visit.  Specialty Comments:  No specialty comments  available.  PMFS History: Patient Active Problem List   Diagnosis Date Noted   Coronary artery disease, non-occlusive 07/02/2019   Essential hypertension 07/02/2019   Costochondritis 07/02/2019   Hyperlipidemia, mixed 07/02/2019   Counseling regarding advance care planning and goals of care 07/02/2019   Severe late onset Alzheimer's dementia with agitation (HCC) 12/28/2018   Past Medical History:  Diagnosis Date   Breast cancer (HCC)    Reportedly admission   Dementia (HCC)    Hip fracture requiring operative repair, right, sequela 04/2018   Likely related to fall- s/p ORIF-right femur fracture   Hypertension    Macular degeneration    Osteoarthritis (arthritis due to wear and tear of joints)    Hips and knees as well as hands   Osteoporosis    Pelvic fracture (HCC) 11/14/2018   and coccyx; late the fall.  Medical management    Family History  Problem Relation Age of Onset   Stroke Mother    Cancer Father    Cancer Sister    Cancer Brother    Cancer Other        2 sisters and several brothers    Past Surgical History:  Procedure Laterality Date   BREAST LUMPECTOMY     malignant   cyst removal     from lower back, rods placed   LEFT HEART CATH AND CORONARY ANGIOGRAPHY  03/2017   [facility]): EF 55%; large LAD (diffuse moderate disease) with 3 diagonal branches.  D2 and D3 are small; #1 large caliber LCx with 3 OM branches (OM1 and OM 2 are small, OM 3 medium caliber main branch with minimal disease.  Large dominant RCA with moderate proximal disease, mid 30 to 40%.  Medium sized PDA with minimal disease; FFR 50% mLAD - 0.96* NOT SIGNIFICANT   NM MYOVIEW  LTD  06/2014   [facility]: No evidence of ischemia or infarction.   TOTAL HIP ARTHROPLASTY     broken femur, R side, repaired   TRANSTHORACIC ECHOCARDIOGRAM  05/10/2018    LVEF 60 to 65%.  GR 1 DD.  Normal RV.  Mild RA and mild to moderate LA dilation.  Sclerotic aortic valve with no stenosis.    Social History   Occupational History   Not on file  Tobacco Use   Smoking status: Never    Passive exposure: Never   Smokeless tobacco: Never  Vaping Use   Vaping status: Never Used  Substance and Sexual Activity   Alcohol use: Never   Drug use: Never   Sexual activity: Not on file

## 2023-12-29 ENCOUNTER — Telehealth: Payer: Self-pay | Admitting: Orthopedic Surgery

## 2023-12-29 ENCOUNTER — Encounter: Payer: Self-pay | Admitting: Orthopedic Surgery

## 2023-12-29 ENCOUNTER — Telehealth: Payer: Self-pay

## 2023-12-29 NOTE — Telephone Encounter (Signed)
 I called and left a message for the social worker Mr. Darlyn and advised of Dr. Crist concern about coverage of the visit. She is a hospice pt and this care that she received in the office is to help keep her comfortable and becoming septic. I called and left a message for the pt's daughter to advise that I have reached out and I am awaiting a return call to discuss. She can reach out to me at any time and I will keep her up to date on any information that I receive.

## 2023-12-29 NOTE — Telephone Encounter (Signed)
 I spoke with Annmarie with Hospice and she advised that the pt was referred to an ortho for knee pain and that ortho referred to us  without clearance with Hospice. The pt is provided wound care in the home ut an office visit would not be covered. I faxed her office note from yesterdays appt to [phone] and they will order Vashe for the patient. However s return visit would not be covered. If it is ok with you would it be alright to no charge the office visit from yesterday and for the next follow up visit?

## 2023-12-29 NOTE — Telephone Encounter (Signed)
 Annmarie with Hospice called to speak with Autumn about the care for patient. Her cb# [phone]

## 2023-12-29 NOTE — Telephone Encounter (Signed)
 I called and vm for Annmarie requesting call back to discuss pt care.

## 2023-12-31 ENCOUNTER — Ambulatory Visit: Admitting: Internal Medicine

## 2024-01-13 ENCOUNTER — Encounter: Payer: Self-pay | Admitting: Orthopedic Surgery

## 2024-01-13 ENCOUNTER — Ambulatory Visit (INDEPENDENT_AMBULATORY_CARE_PROVIDER_SITE_OTHER): Admitting: Orthopedic Surgery

## 2024-01-13 DIAGNOSIS — L8945 Pressure ulcer of contiguous site of back, buttock and hip, unstageable: Secondary | ICD-10-CM

## 2024-01-13 DIAGNOSIS — I70234 Atherosclerosis of native arteries of right leg with ulceration of heel and midfoot: Secondary | ICD-10-CM

## 2024-01-13 NOTE — Progress Notes (Signed)
 Office Visit Note   Patient: Sandra Cruz           Date of Birth: [birth year]           MRN: [id number] Visit Date: 01/13/2024              Requested by: Kip Righter, MD [address] [place],  [place] [address] PCP: Kip Righter, MD  Chief Complaint  Patient presents with   Right Foot - Follow-up   Left Foot - Follow-up      HPI: Discussed the use of AI scribe software for clinical note transcription with the patient, who gave verbal consent to proceed.  History of Present Illness Sandra Cruz is a 89 year old female who presents for wound care management.  She has a deep wound approximately an inch deep that has shown significant changes over the past two weeks. The wound care involves cleaning with gauze and packing with Vosh and gauze. A new caregiver expressed concerns about the wound potentially becoming septic and suggested the use of silver alginate, but current treatment with Vosh is preferred.  The wound is located close to the bone, raising concerns about its proximity. The caregiver inquired about the possibility of surgery if she were younger, but it was noted that surgery is generally not performed for such wounds, regardless of age. The wound care involves removing dead tissue and ensuring the wound is packed adequately to promote healing from the inside out.  Nutritional support is provided through Ensure, and repositioning every two hours is recommended to alleviate pressure and promote healing. The caregiver uses pillows to support her knees and hips during repositioning. The wound care process includes debridement of nonviable tissue.  She has a history of easy bruising, attributed to fragile veins that leak blood easily. The caregiver inquired about supplements to aid healing, and L-arginine was mentioned as a potential option. The caregiver is cautious about using bandages, as they may irritate the skin, which is described as 'paper  thin.'     Assessment & Plan: Visit Diagnoses:  1. Atherosclerosis of native arteries of right leg with ulceration of heel and midfoot (HCC)   2. Pressure injury of contiguous region involving back, buttock, and hip, unstageable, unspecified laterality (HCC)     Plan: Assessment and Plan Assessment & Plan Chronic stage 4 sacral and stage 3 unspecified site pressure ulcers with deep tissue involvement Ulcers improving with current treatment. Deep wound near bone. Red and bleeding tissue indicates healing. - Continue Vosh and gauze for wound care due to superior antibacterial properties and healing outcomes. - Debride nonviable tissue aggressively until bleeding tissue is visible. - Pack deep wounds with dampened gauze, ensuring to fill dead space. - Roll side to side every two hours to relieve pressure. - Consider protein supplements like Ensure to aid in healing. - Use L-arginine as a supplement to support healing.  Skin fragility and spontaneous ecchymoses Bruising occurs easily and resolves slowly. - Avoid using bandages that may irritate the skin and cause tearing.      Follow-Up Instructions: Return if symptoms worsen or fail to improve.   Ortho Exam  Patient is alert, oriented, no adenopathy, well-dressed, normal affect, normal respiratory effort. Physical Exam SKIN: Wounds appear healthy with appropriate redness and bleeding indicative of healing.   There is no ascending cellulitis.  Patient has shown significant improvement with using the Vashe dressing changes.  Patient is also showing improvement without side effects  with using the nitroglycerin  patches.   Imaging: No results found. No images are attached to the encounter.  Labs: No results found for: HGBA1C, ESRSEDRATE, CRP, LABURIC, REPTSTATUS, GRAMSTAIN, CULT, LABORGA   Lab Results  Component Value Date   ALBUMIN 3.2 (L) 08/31/2023   ALBUMIN 3.8 12/18/2022    Lab Results  Component  Value Date   MG 3.0 (H) 08/31/2023   No results found for: VD25OH  No results found for: PREALBUMIN    Latest Ref Rng & Units 08/31/2023    9:07 PM 12/18/2022   10:10 AM 11/14/2018    5:55 AM  CBC EXTENDED  WBC 4.0 - 10.5 K/uL 13.2  6.9  12.8   RBC 3.87 - 5.11 MIL/uL 3.10  4.08  4.26   Hemoglobin 12.0 - 15.0 g/dL 9.6  86.9  86.9   HCT 63.9 - 46.0 % 29.5  38.6  41.4   Platelets 150 - 400 K/uL 217  228  174   NEUT# 1.7 - 7.7 K/uL 10.5  3.9  10.7   Lymph# 0.7 - 4.0 K/uL 1.4  1.3  1.1      There is no height or weight on file to calculate BMI.  Orders:  No orders of the defined types were placed in this encounter.  No orders of the defined types were placed in this encounter.    Procedures: No procedures performed  Clinical Data: No additional findings.  ROS:  All other systems negative, except as noted in the HPI. Review of Systems  Objective: Vital Signs: There were no vitals taken for this visit.  Specialty Comments:  No specialty comments available.  PMFS History: Patient Active Problem List   Diagnosis Date Noted   Coronary artery disease, non-occlusive 07/02/2019   Essential hypertension 07/02/2019   Costochondritis 07/02/2019   Hyperlipidemia, mixed 07/02/2019   Counseling regarding advance care planning and goals of care 07/02/2019   Severe late onset Alzheimer's dementia with agitation (HCC) 12/28/2018   Past Medical History:  Diagnosis Date   Breast cancer (HCC)    Reportedly admission   Dementia (HCC)    Hip fracture requiring operative repair, right, sequela 04/2018   Likely related to fall- s/p ORIF-right femur fracture   Hypertension    Macular degeneration    Osteoarthritis (arthritis due to wear and tear of joints)    Hips and knees as well as hands   Osteoporosis    Pelvic fracture (HCC) 11/14/2018   and coccyx; late the fall.  Medical management    Family History  Problem Relation Age of Onset   Stroke Mother    Cancer Father     Cancer Sister    Cancer Brother    Cancer Other        2 sisters and several brothers    Past Surgical History:  Procedure Laterality Date   BREAST LUMPECTOMY     malignant   cyst removal     from lower back, rods placed   LEFT HEART CATH AND CORONARY ANGIOGRAPHY  03/2017   [facility]): EF 55%; large LAD (diffuse moderate disease) with 3 diagonal branches.  D2 and D3 are small; #1 large caliber LCx with 3 OM branches (OM1 and OM 2 are small, OM 3 medium caliber main branch with minimal disease.  Large dominant RCA with moderate proximal disease, mid 30 to 40%.  Medium sized PDA with minimal disease; FFR 50% mLAD - 0.96* NOT SIGNIFICANT   NM MYOVIEW  LTD  06/2014  [facility]: No evidence of ischemia or infarction.   TOTAL HIP ARTHROPLASTY     broken femur, R side, repaired   TRANSTHORACIC ECHOCARDIOGRAM  05/10/2018    LVEF 60 to 65%.  GR 1 DD.  Normal RV.  Mild RA and mild to moderate LA dilation.  Sclerotic aortic valve with no stenosis.   Social History   Occupational History   Not on file  Tobacco Use   Smoking status: Never    Passive exposure: Never   Smokeless tobacco: Never  Vaping Use   Vaping status: Never Used  Substance and Sexual Activity   Alcohol use: Never   Drug use: Never   Sexual activity: Not on file

## 2024-02-10 ENCOUNTER — Encounter: Payer: Self-pay | Admitting: Orthopedic Surgery

## 2024-02-10 NOTE — Telephone Encounter (Signed)
 Pts daughter Nathanel came and picked up the cream and identification was checked.

## 2024-02-23 ENCOUNTER — Telehealth: Payer: Self-pay

## 2024-02-23 ENCOUNTER — Encounter: Payer: Self-pay | Admitting: Family Medicine

## 2024-02-23 NOTE — Telephone Encounter (Signed)
 Forwarding to Dr. Denyse Amass to review and advise.

## 2024-02-23 NOTE — Telephone Encounter (Signed)
 Can we check benefits for Zilretta  for bilat knee OA.   Last Zilretta  injections 12/02/23 Appt scheduled 03/02/24

## 2024-02-24 NOTE — Telephone Encounter (Signed)
 Ran benefits for bilateral zilretta  [id number]

## 2024-03-02 ENCOUNTER — Ambulatory Visit (INDEPENDENT_AMBULATORY_CARE_PROVIDER_SITE_OTHER): Admitting: Family Medicine

## 2024-03-02 ENCOUNTER — Other Ambulatory Visit: Payer: Self-pay

## 2024-03-02 ENCOUNTER — Encounter: Payer: Self-pay | Admitting: Family Medicine

## 2024-03-02 VITALS — Ht 64.0 in

## 2024-03-02 DIAGNOSIS — G8929 Other chronic pain: Secondary | ICD-10-CM

## 2024-03-02 DIAGNOSIS — M25562 Pain in left knee: Secondary | ICD-10-CM

## 2024-03-02 DIAGNOSIS — M170 Bilateral primary osteoarthritis of knee: Secondary | ICD-10-CM | POA: Diagnosis not present

## 2024-03-02 DIAGNOSIS — M25561 Pain in right knee: Secondary | ICD-10-CM

## 2024-03-02 MED ORDER — TRIAMCINOLONE ACETONIDE 32 MG IX SRER
32.0000 mg | Freq: Once | INTRA_ARTICULAR | Status: AC
  Administered 2024-03-02: 32 mg via INTRA_ARTICULAR

## 2024-03-02 NOTE — Telephone Encounter (Signed)
 [facility] and spoke with Lyn L  Call reference #[id number]  Confirmed that prior authorization for Zilretta  [phone]) is on file and valid for bilateral knees (M17.0). No quantity limits (999). Valid under Part B benefits.   Prior Authorization number [id number] Valid: 11/23/23-05/25/24

## 2024-03-02 NOTE — Patient Instructions (Signed)
 Thank you for coming in today.   You received an injection today. Seek immediate medical attention if the joint becomes red, extremely painful, or is oozing fluid.

## 2024-03-02 NOTE — Progress Notes (Signed)
   I, Leotis Batter, CMA acting as a scribe for Artist Lloyd, MD.  Sandra Cruz is a 89 y.o. female who presents to [facility] today for exacerbation of her bilat knee pain. Pt was last seen by Dr. Lloyd on 12/02/23 and was given bil knee Zilretta  injections.  Today, pt reports exacerbation of knee pain / OA.   Pertinent review of systems: No fevers or chills  Relevant historical information: Severe dementia   Exam:  Ht 5' 4 (1.626 m)   BMI 24.03 kg/m  General: Elderly woman seated in a wheelchair nonverbal no acute distress  MSK: Left knee contracture decreased muscle bulk.  Nontender to palpation.  Right knee healed wound anterior knee nontender to palpation.    Lab and Radiology Results    Zilretta  injection bilateral knee Procedure: Real-time Ultrasound Guided Injection of left knee joint superior lateral patellar space Device: Philips Affiniti 50G Images permanently stored and available for review in PACS Verbal informed consent obtained.  Discussed risks and benefits of procedure. Warned about infection, hyperglycemia bleeding, damage to structures among others. Patient expresses understanding and agreement Time-out conducted.   Noted no overlying erythema, induration, or other signs of local infection.   Skin prepped in a sterile fashion.   Local anesthesia: Topical Ethyl chloride.   With sterile technique and under real time ultrasound guidance: Zilretta  32 mg injected into knee joint. Fluid seen entering the joint capsule.   Completed without difficulty   Advised to call if fevers/chills, erythema, induration, drainage, or persistent bleeding.   Images permanently stored and available for review in the ultrasound unit.  Impression: Technically successful ultrasound guided injection.   Procedure: Real-time Ultrasound Guided Injection of right knee joint superior lateral patellar space Device: Philips Affiniti 50G Images  permanently stored and available for review in PACS Verbal informed consent obtained.  Discussed risks and benefits of procedure. Warned about infection, hyperglycemia bleeding, damage to structures among others. Patient expresses understanding and agreement Time-out conducted.   Noted no overlying erythema, induration, or other signs of local infection.   Skin prepped in a sterile fashion.   Local anesthesia: Topical Ethyl chloride.   With sterile technique and under real time ultrasound guidance: Zilretta  32 mg injected into knee joint. Fluid seen entering the joint capsule.   Completed without difficulty   Advised to call if fevers/chills, erythema, induration, drainage, or persistent bleeding.   Images permanently stored and available for review in the ultrasound unit.  Impression: Technically successful ultrasound guided injection.  Lot number: [id number]      Assessment and Plan: 89 y.o. female with bilateral knee pain due to DJD.  Plan for Zilretta  injection both knees.  We can repeat this in 3 months.   PDMP not reviewed this encounter. Orders Placed This Encounter  Procedures   US  LIMITED JOINT SPACE STRUCTURES LOW BILAT(NO LINKED CHARGES)    Reason for Exam (SYMPTOM  OR DIAGNOSIS REQUIRED):   bilat knee pain    Preferred imaging location?:   [facility] ordered this encounter  Medications   Triamcinolone  Acetonide (ZILRETTA ) intra-articular injection 32 mg   Triamcinolone  Acetonide (ZILRETTA ) intra-articular injection 32 mg     Discussed warning signs or symptoms. Please see discharge instructions. Patient expresses understanding.   The above documentation has been reviewed and is accurate and complete Artist Lloyd, M.D.

## 2024-03-20 ENCOUNTER — Encounter: Payer: Self-pay | Admitting: Radiology

## 2024-04-04 ENCOUNTER — Other Ambulatory Visit: Payer: Self-pay

## 2024-04-04 DIAGNOSIS — I70234 Atherosclerosis of native arteries of right leg with ulceration of heel and midfoot: Secondary | ICD-10-CM

## 2024-04-04 MED ORDER — NITROGLYCERIN 0.2 MG/HR TD PT24: 0.2000 mg | MEDICATED_PATCH | Freq: Every day | TRANSDERMAL | 12 refills | Status: AC

## 2024-05-16 ENCOUNTER — Other Ambulatory Visit: Payer: Self-pay | Admitting: Internal Medicine

## 2024-05-16 ENCOUNTER — Encounter: Payer: Self-pay | Admitting: Internal Medicine

## 2024-05-17 ENCOUNTER — Other Ambulatory Visit: Payer: Self-pay | Admitting: *Deleted

## 2024-05-17 ENCOUNTER — Other Ambulatory Visit (HOSPITAL_COMMUNITY): Payer: Self-pay

## 2024-05-17 MED ORDER — DUPIXENT 300 MG/2ML ~~LOC~~ SOSY: 300.0000 mg | PREFILLED_SYRINGE | SUBCUTANEOUS | 11 refills | Status: AC

## 2024-05-22 ENCOUNTER — Telehealth: Payer: Self-pay

## 2024-05-22 NOTE — Telephone Encounter (Signed)
"  error  "

## 2024-05-22 NOTE — Telephone Encounter (Signed)
 Zilretta  benefits ran for bilateral knee case ID [id number]

## 2024-05-22 NOTE — Telephone Encounter (Signed)
 Had to run pre cert through availity. Case number [id number]

## 2024-05-23 ENCOUNTER — Other Ambulatory Visit (HOSPITAL_COMMUNITY): Payer: Self-pay

## 2024-05-23 NOTE — Telephone Encounter (Signed)
 Scheduled 06/02/24  Zilretta  authorized for bilateral knee PRE CERT REQUIRED  Deductible does not apply OOP MAX $2590 has met $0 Coinsurance is 4% Once OOP has been met coverage goes to 100% [place] APPROVED AUTH # [id number] 05/26/24-05/17/25

## 2024-05-23 NOTE — Telephone Encounter (Signed)
 Noted

## 2024-06-02 ENCOUNTER — Ambulatory Visit: Admitting: Family Medicine

## 2024-06-13 ENCOUNTER — Telehealth: Admitting: Internal Medicine

## 2024-06-13 ENCOUNTER — Encounter: Payer: Self-pay | Admitting: Internal Medicine

## 2024-06-13 DIAGNOSIS — L281 Prurigo nodularis: Secondary | ICD-10-CM | POA: Diagnosis not present

## 2024-06-13 DIAGNOSIS — F02C11 Dementia in other diseases classified elsewhere, severe, with agitation: Secondary | ICD-10-CM | POA: Diagnosis not present

## 2024-06-13 DIAGNOSIS — G301 Alzheimer's disease with late onset: Secondary | ICD-10-CM | POA: Diagnosis not present

## 2024-06-13 NOTE — Telephone Encounter (Signed)
 I spoke with the patient's daughter and got her rescheduled for a video visit in [facility].

## 2024-06-13 NOTE — Progress Notes (Signed)
 "  RE: Sandra PAMINTUAN MRN: [id number] DOB: 1930-03-04 Date of Telemedicine Visit: 06/13/2024  Referring provider: Kip Righter, MD Primary care provider: Kip Righter, MD  Chief Complaint: Follow-up (Dupixent  reapproval)   Telemedicine Follow Up Visit via Telephone: I connected with Sandra Cruz for a follow up on 06/16/24 by telephone and verified that I am speaking with the correct person using two identifiers.   I discussed the limitations, risks, security and privacy concerns of performing an evaluation and management service by WebEx/telehealth and the availability of in person appointments. I also discussed with the patient that there may be a patient responsible charge related to this service. The patient expressed understanding and agreed to proceed.  Patient is at home  accompanied by daughter who provided/contributed to the history.  Provider is at the office.  Visit start time: 3:55 Visit end time: 4:05 Insurance consent/check in by: Redell  Medical consent and medical assistant/nurse: Charisma   History of Present Illness: She is a 89 y.o. female, who is being followed for PN, pruritus. Her previous allergy  office visit was on 12/17/23 with Dr. Lorin .    Pruritus management - Chronic pruritus treated with Dupixent  - Dupixent  administered every two weeks - there has been a 2 month delay in giving dupixent  as Candida had other medical complications which required focusing on  - They would like to restart as she continues to scratch until she bleeds  - Has continued to take allegra 180mg  BID.  - No side effects from the medications  Her dementia continues to progress making coming in person for visits difficult  Assessment and Plan: [name] is a 89 y.o. female with:  Prurigo nodularis  Severe late onset Alzheimer's dementia with agitation (HCC)  Patient Instructions  Prugio Nodularis- histamine driven  Continue Allegra 180mg  twice daily  RESTART Dupixent  300mg  every 4  weeks  Continue gentle skin care   Follow up: 6  months   Thank you so much for letting me partake in your care today.  Don't hesitate to reach out if you have any additional concerns!  [name], MD  [facility] [place], [place]   Daily Skin Care  - Recommend hypoallergenic hydrating ointment at least twice daily.  This must be done daily for control of flares. (Great options include Vaseline, CeraVe, Aquaphor, Aveeno, Cetaphil, VaniCream, etc) - Recommend avoiding detergents, soaps or lotions with fragrances/dyes, and instead using products which are hypoallergenic, use second rinse cycle when washing clothes -Wear lose breathable clothing, avoid wool -Avoid extremes of humidity   Lab Orders  No laboratory test(s) ordered today    Diagnostics: None.  Medication List:  Current Outpatient Medications  Medication Sig Dispense Refill   amLODipine  (NORVASC ) 2.5 MG tablet Take 2.5 mg by mouth 2 (two) times a day.     carvedilol  (COREG ) 3.125 MG tablet Take 3.125 mg by mouth 2 (two) times a day.     clotrimazole-betamethasone (LOTRISONE) cream Apply 1 application  topically 2 (two) times daily. As needed     CRANBERRY PO Take by mouth. gummies     Docusate Sodium (COLACE PO) Take 3 tablets by mouth daily.     donepezil  (ARICEPT ) 10 MG tablet Take 1 tablet (10 mg total) by mouth every evening. 90 tablet 3   dupilumab  (DUPIXENT ) 300 MG/2ML prefilled syringe Inject 300 mg into the skin every 14 (fourteen) days. 4 mL 11   DUPIXENT  300 MG/2ML prefilled syringe INJECT 300MG  (1 INJECTION) SUBCUTANEOUSLY EVERY 14  DAYS 12 mL 310   Ferrous Sulfate (IRON PO) Take 65 mg by mouth daily.     MAGNESIUM CITRATE PO Take by mouth.     melatonin 3 MG TABS tablet Take 1.5 mg by mouth at bedtime.     memantine  (NAMENDA ) 10 MG tablet Take 1 tablet (10 mg total) by mouth 2 (two) times daily. 180 tablet 3   Multiple Vitamin (MULTIVITAMIN PO) 1 tablet at noon. Brand: Life extension-High  Potency Multivitamin and Mineral Supplement     nitroGLYCERIN  (NITRODUR - DOSED IN MG/24 HR) 0.2 mg/hr patch Place 1 patch (0.2 mg total) onto the skin daily. 30 patch 12   nystatin cream (MYCOSTATIN) Apply 1 Application topically 2 (two) times daily as needed.     Omega-3 Fatty Acids (FISH OIL) 500 MG CAPS Take 1 each by mouth daily at 12 noon.     OVER THE COUNTER MEDICATION Calcium     Probiotic Product (PROBIOTIC PO) Take 1 tablet by mouth daily at 12 noon. 15 strains/25 billion microorganisms     senna (SENOKOT) 8.6 MG TABS tablet Take 3 tablets by mouth as needed for mild constipation.     sodium chloride  1 g tablet Take 1 g by mouth daily.     tiZANidine (ZANAFLEX) 2 MG tablet Take 2 mg by mouth 3 (three) times daily.     triamcinolone  cream (KENALOG ) 0.1 % Apply 1 Application topically as needed.     No current facility-administered medications for this visit.   Allergies: Allergies[1] I reviewed her past medical history, social history, family history, and environmental history and no significant changes have been reported from previous visit on .  Review of Systems  All other systems reviewed and are negative.  Objective: Physical Exam Patient in NAD based on video appearance, increasingly nonverbal, excoriation marks seen on skin   Previous notes and tests were reviewed.  I discussed the assessment and treatment plan with the patient. The patient was provided an opportunity to ask questions and all were answered. The patient agreed with the plan and demonstrated an understanding of the instructions.   The patient was advised to call back or seek an in-person evaluation if the symptoms worsen or if the condition fails to improve as anticipated.  I provided 10 minutes of non-face-to-face time during this encounter.  It was my pleasure to participate in [facility] care today. Please feel free to contact me with any questions or concerns.   Sincerely,  [name],  MD     [1]  Allergies Allergen Reactions   Codeine Shortness Of Breath   Penicillins Swelling and Rash    Did it involve swelling of the face/tongue/throat, SOB, or low BP? No Did it involve sudden or severe rash/hives, skin peeling, or any reaction on the inside of your mouth or nose? No Did you need to seek medical attention at a hospital or doctor's office? No When did it last happen?      childhood If all above answers are NO, may proceed with cephalosporin use.   Shellfish Allergy  Shortness Of Breath   Latex Other (See Comments)    blisters   "

## 2024-06-16 NOTE — Patient Instructions (Addendum)
 Prugio Nodularis- histamine driven  Continue Allegra 180mg  twice daily  RESTART Dupixent  300mg  every 4 weeks  Continue gentle skin care   Follow up: 6  months   Thank you so much for letting me partake in your care today.  Don't hesitate to reach out if you have any additional concerns!  [name], MD  [facility] [place], [place]   Daily Skin Care  - Recommend hypoallergenic hydrating ointment at least twice daily.  This must be done daily for control of flares. (Great options include Vaseline, CeraVe, Aquaphor, Aveeno, Cetaphil, VaniCream, etc) - Recommend avoiding detergents, soaps or lotions with fragrances/dyes, and instead using products which are hypoallergenic, use second rinse cycle when washing clothes -Wear lose breathable clothing, avoid wool -Avoid extremes of humidity

## 2024-06-23 ENCOUNTER — Telehealth: Admitting: Internal Medicine

## 2024-06-28 ENCOUNTER — Ambulatory Visit: Admitting: Family Medicine

## 2024-06-29 ENCOUNTER — Ambulatory Visit: Admitting: Family Medicine
